# Patient Record
Sex: Female | Born: 1970 | Hispanic: No | Marital: Single | Smoking: Former smoker
Health system: Southern US, Community
[De-identification: ages and names within clinical notes are randomized; demographics above are authoritative.]

## PROBLEM LIST (undated history)

## (undated) DIAGNOSIS — Z9989 Dependence on other enabling machines and devices: Secondary | ICD-10-CM

## (undated) DIAGNOSIS — E669 Obesity, unspecified: Secondary | ICD-10-CM

## (undated) DIAGNOSIS — N921 Excessive and frequent menstruation with irregular cycle: Secondary | ICD-10-CM

## (undated) DIAGNOSIS — I509 Heart failure, unspecified: Secondary | ICD-10-CM

## (undated) DIAGNOSIS — I10 Essential (primary) hypertension: Secondary | ICD-10-CM

## (undated) DIAGNOSIS — G4733 Obstructive sleep apnea (adult) (pediatric): Secondary | ICD-10-CM

## (undated) DIAGNOSIS — K219 Gastro-esophageal reflux disease without esophagitis: Secondary | ICD-10-CM

## (undated) DIAGNOSIS — I2699 Other pulmonary embolism without acute cor pulmonale: Secondary | ICD-10-CM

## (undated) DIAGNOSIS — J449 Chronic obstructive pulmonary disease, unspecified: Secondary | ICD-10-CM

## (undated) DIAGNOSIS — D649 Anemia, unspecified: Secondary | ICD-10-CM

## (undated) DIAGNOSIS — E119 Type 2 diabetes mellitus without complications: Secondary | ICD-10-CM

## (undated) HISTORY — DX: Anemia, unspecified: D64.9

## (undated) HISTORY — DX: Obesity, unspecified: E66.9

## (undated) HISTORY — DX: Obstructive sleep apnea (adult) (pediatric): G47.33

## (undated) HISTORY — DX: Obstructive sleep apnea (adult) (pediatric): Z99.89

## (undated) HISTORY — DX: Excessive and frequent menstruation with irregular cycle: N92.1

## (undated) HISTORY — DX: Essential (primary) hypertension: I10

## (undated) HISTORY — DX: Chronic obstructive pulmonary disease, unspecified: J44.9

## (undated) HISTORY — DX: Gastro-esophageal reflux disease without esophagitis: K21.9

---

## 2001-08-27 ENCOUNTER — Other Ambulatory Visit: Admission: RE | Admit: 2001-08-27 | Discharge: 2001-08-27 | Payer: Self-pay | Admitting: Family Medicine

## 2002-02-06 ENCOUNTER — Encounter: Admission: RE | Admit: 2002-02-06 | Discharge: 2002-02-06 | Payer: Self-pay | Admitting: Family Medicine

## 2002-02-06 ENCOUNTER — Encounter: Payer: Self-pay | Admitting: Family Medicine

## 2002-07-22 ENCOUNTER — Encounter: Admission: RE | Admit: 2002-07-22 | Discharge: 2002-07-22 | Payer: Self-pay | Admitting: Family Medicine

## 2002-07-22 ENCOUNTER — Encounter: Payer: Self-pay | Admitting: Family Medicine

## 2003-04-04 DIAGNOSIS — J449 Chronic obstructive pulmonary disease, unspecified: Secondary | ICD-10-CM

## 2003-04-04 HISTORY — DX: Chronic obstructive pulmonary disease, unspecified: J44.9

## 2003-11-05 ENCOUNTER — Other Ambulatory Visit: Admission: RE | Admit: 2003-11-05 | Discharge: 2003-11-05 | Payer: Self-pay | Admitting: Family Medicine

## 2005-08-24 ENCOUNTER — Other Ambulatory Visit: Admission: RE | Admit: 2005-08-24 | Discharge: 2005-08-24 | Payer: Self-pay | Admitting: Family Medicine

## 2006-08-28 ENCOUNTER — Other Ambulatory Visit: Admission: RE | Admit: 2006-08-28 | Discharge: 2006-08-28 | Payer: Self-pay | Admitting: Family Medicine

## 2006-09-20 ENCOUNTER — Encounter: Admission: RE | Admit: 2006-09-20 | Discharge: 2006-09-20 | Payer: Self-pay | Admitting: Family Medicine

## 2011-10-31 ENCOUNTER — Telehealth: Payer: Self-pay

## 2011-10-31 NOTE — Telephone Encounter (Signed)
Message directed to [name]-Patient started period around the first of July and it may have stopped for about a week and then started up the third week and hasn't stopped since. It is not heavy regular except for a couple of days but she is concerned. Between 8:30-5:00 [phone]

## 2011-10-31 NOTE — Telephone Encounter (Signed)
She has appt scheduled in August she was advised to come in sooner to be checked. She will come in to see you on Friday.

## 2011-11-01 NOTE — Telephone Encounter (Signed)
Noted  

## 2011-11-02 ENCOUNTER — Encounter: Payer: Self-pay | Admitting: Physician Assistant

## 2011-11-23 ENCOUNTER — Encounter: Payer: Self-pay | Admitting: Physician Assistant

## 2011-11-23 ENCOUNTER — Ambulatory Visit (INDEPENDENT_AMBULATORY_CARE_PROVIDER_SITE_OTHER): Payer: BC Managed Care – PPO | Admitting: Physician Assistant

## 2011-11-23 VITALS — BP 158/90 | HR 81 | Temp 97.7°F | Resp 18 | Ht 64.5 in | Wt 372.0 lb

## 2011-11-23 DIAGNOSIS — Z1211 Encounter for screening for malignant neoplasm of colon: Secondary | ICD-10-CM

## 2011-11-23 DIAGNOSIS — J449 Chronic obstructive pulmonary disease, unspecified: Secondary | ICD-10-CM | POA: Insufficient documentation

## 2011-11-23 DIAGNOSIS — E669 Obesity, unspecified: Secondary | ICD-10-CM

## 2011-11-23 DIAGNOSIS — IMO0001 Reserved for inherently not codable concepts without codable children: Secondary | ICD-10-CM

## 2011-11-23 DIAGNOSIS — Z124 Encounter for screening for malignant neoplasm of cervix: Secondary | ICD-10-CM

## 2011-11-23 DIAGNOSIS — Z0000 Encounter for general adult medical examination without abnormal findings: Secondary | ICD-10-CM

## 2011-11-23 DIAGNOSIS — G4733 Obstructive sleep apnea (adult) (pediatric): Secondary | ICD-10-CM | POA: Insufficient documentation

## 2011-11-23 DIAGNOSIS — R8271 Bacteriuria: Secondary | ICD-10-CM

## 2011-11-23 DIAGNOSIS — R82998 Other abnormal findings in urine: Secondary | ICD-10-CM

## 2011-11-23 DIAGNOSIS — Z6841 Body Mass Index (BMI) 40.0 and over, adult: Secondary | ICD-10-CM | POA: Insufficient documentation

## 2011-11-23 DIAGNOSIS — Z309 Encounter for contraceptive management, unspecified: Secondary | ICD-10-CM

## 2011-11-23 DIAGNOSIS — Z9989 Dependence on other enabling machines and devices: Secondary | ICD-10-CM

## 2011-11-23 LAB — CBC WITH DIFFERENTIAL/PLATELET
Basophils Absolute: 0.1 10*3/uL (ref 0.0–0.1)
Basophils Relative: 1 % (ref 0–1)
Eosinophils Absolute: 0.3 10*3/uL (ref 0.0–0.7)
Eosinophils Relative: 3 % (ref 0–5)
HCT: 38.7 % (ref 36.0–46.0)
Hemoglobin: 12.8 g/dL (ref 12.0–15.0)
Lymphocytes Relative: 21 % (ref 12–46)
Lymphs Abs: 2.2 10*3/uL (ref 0.7–4.0)
MCH: 25.5 pg — ABNORMAL LOW (ref 26.0–34.0)
MCHC: 33.1 g/dL (ref 30.0–36.0)
MCV: 77.1 fL — ABNORMAL LOW (ref 78.0–100.0)
Monocytes Absolute: 0.6 10*3/uL (ref 0.1–1.0)
Monocytes Relative: 6 % (ref 3–12)
Neutro Abs: 7.2 10*3/uL (ref 1.7–7.7)
Neutrophils Relative %: 69 % (ref 43–77)
Platelets: 422 10*3/uL — ABNORMAL HIGH (ref 150–400)
RBC: 5.02 MIL/uL (ref 3.87–5.11)
RDW: 16 % — ABNORMAL HIGH (ref 11.5–15.5)
WBC: 10.3 10*3/uL (ref 4.0–10.5)

## 2011-11-23 LAB — GLUCOSE, POCT (MANUAL RESULT ENTRY): POC Glucose: 108 mg/dl — AB (ref 70–99)

## 2011-11-23 LAB — POCT URINALYSIS DIPSTICK
Bilirubin, UA: NEGATIVE
Blood, UA: NEGATIVE
Glucose, UA: NEGATIVE
Ketones, UA: NEGATIVE
Leukocytes, UA: NEGATIVE
Nitrite, UA: NEGATIVE
Protein, UA: NEGATIVE
Spec Grav, UA: >=1.03
Urobilinogen, UA: 0.2
pH, UA: 5.5

## 2011-11-23 LAB — POCT UA - MICROSCOPIC ONLY
Casts, Ur, LPF, POC: NEGATIVE
Crystals, Ur, HPF, POC: POSITIVE
Yeast, UA: NEGATIVE

## 2011-11-23 LAB — POCT GLYCOSYLATED HEMOGLOBIN (HGB A1C): Hemoglobin A1C: 5.7

## 2011-11-23 LAB — POCT URINE PREGNANCY: Preg Test, Ur: NEGATIVE

## 2011-11-23 MED ORDER — NORGESTIMATE-ETH ESTRADIOL 0.25-35 MG-MCG PO TABS: 1.0000 | ORAL_TABLET | Freq: Every day | ORAL | Status: DC

## 2011-11-23 NOTE — Patient Instructions (Addendum)
Please seriously consider getting a mammogram this year. You MAY have a UTI.  I have sent your urine specimen for a culture and will let you know when I get the results.  If you develop urinary urgency, frequency or burning in the meantime, please call.  Keeping You Healthy  Get These Tests 1. Blood Pressure- Have your blood pressure checked once a year by your health care provider.  Normal blood pressure is 120/80. 2. Weight- Have your body mass index (BMI) calculated to screen for obesity.  BMI is measure of body fat based on height and weight.  You can also calculate your own BMI at https://www.west-esparza.com/. 3. Cholesterol- Have your cholesterol checked every 5 years starting at age 48 then yearly starting at age 54. 4. Chlamydia, HIV, and other sexually transmitted diseases- Get screened every year until age 26, then within three months of each new sexual provider. 5. Pap Smear- Every 1-3 years; discuss with your health care provider. 6. Mammogram- Every year starting at age 67  Take these medicines  Calcium with Vitamin D-Your body needs 1200 mg of Calcium each day and [phone] IU of Vitamin D daily.  Your body can only absorb 500 mg of Calcium at a time so Calcium must be taken in 2 or 3 divided doses throughout the day.  Multivitamin with folic acid- Once daily if it is possible for you to become pregnant.  Get these Immunizations  Tetanus shot- Every 10 years.  Flu shot-Every year.  Take these steps 1. Do not smoke-Your healthcare provider can help you quit.  For tips on how to quit go to www.smokefree.gov or call 1-800 QUITNOW. 2. Be physically active- Exercise 5 days a week for at least 30 minutes.  If you are not already physically active, start slow and gradually work up to 30 minutes of moderate physical activity.  Examples of moderate activity include walking briskly, dancing, swimming, bicycling, etc. 3. Breast Cancer- A self breast exam every month is important for early  detection of breast cancer.  For more information and instruction on self breast exams, ask your healthcare provider or [email or web address]. 4. Eat a healthy diet- Eat a variety of healthy foods such as fruits, vegetables, whole grains, low fat milk, low fat cheeses, yogurt, lean meats, poultry and fish, beans, nuts, tofu, etc.  For more information go to www. Thenutritionsource.org 5. Drink alcohol in moderation- Limit alcohol intake to one drink or less per day. Never drink and drive. 6. Depression- Your emotional health is as important as your physical health.  If you're feeling down or losing interest in things you normally enjoy please talk to your healthcare provider about being screened for depression. 7. Dental visit- Brush and floss your teeth twice daily; visit your dentist twice a year. 8. Eye doctor- Get an eye exam at least every 2 years. 9. Helmet use- Always wear a helmet when riding a bicycle, motorcycle, rollerblading or skateboarding. 10. Safe sex- If you may be exposed to sexually transmitted infections, use a condom. 11. Seat belts- Seat belts can save your live; always wear one. 12. Smoke/Carbon Monoxide detectors- These detectors need to be installed on the appropriate level of your home. Replace batteries at least once a year. 13. Skin cancer- When out in the sun please cover up and use sunscreen 15 SPF or higher. 14. Violence- If anyone is threatening or hurting you, please tell your healthcare provider.

## 2011-11-23 NOTE — Progress Notes (Signed)
Subjective:    Patient ID: Tonya Moore, female    DOB: 03/02/1971, 41 y.o.   MRN: [id number]  HPI  This 41 y.o. female presents for CPE.  She is getting motivated for lifestyle changes for weight loss. "I know what I need to do," but isn't doing it.  She is in a new relationship and would like to start hormonal contraception.  Currently uses condoms.  Is not interested in an IUD.   Review of Systems  Constitutional: Negative.   HENT: Negative.   Eyes: Negative.   Respiratory: Negative.   Cardiovascular: Negative.   Gastrointestinal: Negative.   Genitourinary: Positive for menstrual problem (heavy, long menstrual bleeding in June 2013, no bleeding since then.). Negative for dysuria, urgency, frequency, hematuria, flank pain, decreased urine volume, vaginal bleeding, vaginal discharge, enuresis, difficulty urinating, genital sores, vaginal pain, pelvic pain and dyspareunia.  Musculoskeletal: Negative.   Skin: Negative.   Neurological: Negative.   Hematological: Negative.   Psychiatric/Behavioral: Negative.       Past Medical History  Diagnosis Date  . OSA on CPAP   . Obesity   . COPD (chronic obstructive pulmonary disease) 2005    History reviewed. No pertinent past surgical history.  Prior to Admission medications   Not on File    No Known Allergies  History   Social History  . Marital Status: Single    Spouse Name: n/a    Number of Children: 0  . Years of Education: 16   Occupational History  . Building control surveyor Support    Social History Main Topics  . Smoking status: Former Smoker    Types: Cigarettes    Quit date: 02/23/2004  . Smokeless tobacco: Never Used  . Alcohol Use: Yes     rare  . Drug Use: No  . Sexually Active: Yes -- Female partner(s)    Birth Control/ Protection: Condom   Other Topics Concern  . Not on file   Social History Narrative  . No narrative on file    Family History  Problem Relation Age of Onset  . Cancer Mother     colorectal Ca  . Diabetes Mother        Objective:   Physical Exam  Vitals reviewed. Constitutional: She is oriented to person, place, and time. Vital signs are normal. She appears well-developed and well-nourished. No distress.       Morbidly obese  HENT:  Head: Normocephalic and atraumatic.  Right Ear: Hearing, tympanic membrane, external ear and ear canal normal. No foreign bodies.  Left Ear: Hearing, tympanic membrane, external ear and ear canal normal. No foreign bodies.  Nose: Nose normal.  Mouth/Throat: Uvula is midline, oropharynx is clear and moist and mucous membranes are normal. No oral lesions. Normal dentition. No dental abscesses or uvula swelling. No oropharyngeal exudate.  Eyes: Conjunctivae and EOM are normal. Pupils are equal, round, and reactive to light. Right eye exhibits no discharge. Left eye exhibits no discharge. No scleral icterus.  Fundoscopic exam:      The right eye shows no arteriolar narrowing, no AV nicking, no exudate, no hemorrhage and no papilledema. The right eye shows red reflex.The right eye shows no venous pulsations.      The left eye shows no arteriolar narrowing, no AV nicking, no exudate, no hemorrhage and no papilledema. The left eye shows red reflex.The left eye shows no venous pulsations. Neck: Trachea normal, normal range of motion and full passive range of motion without pain. Neck supple. No  spinous process tenderness and no muscular tenderness present. No mass and no thyromegaly present.  Cardiovascular: Normal rate, regular rhythm, normal heart sounds, intact distal pulses and normal pulses.   Pulmonary/Chest: Effort normal and breath sounds normal. She exhibits no tenderness and no retraction. Right breast exhibits no inverted nipple, no mass, no nipple discharge, no skin change and no tenderness. Left breast exhibits no inverted nipple, no mass, no nipple discharge, no skin change and no tenderness. Breasts are symmetrical.  Abdominal:  Soft. Normal appearance and bowel sounds are normal. She exhibits no distension and no mass. There is no hepatosplenomegaly. There is no tenderness. There is no rigidity, no rebound, no guarding, no CVA tenderness, no tenderness at McBurney's point and negative Murphy's sign. No hernia. Hernia confirmed negative in the right inguinal area and confirmed negative in the left inguinal area.  Genitourinary: Rectum normal, vagina normal and uterus normal. Rectal exam shows no external hemorrhoid and no fissure. No breast swelling, tenderness, discharge or bleeding. Pelvic exam was performed with patient supine. No labial fusion. There is no rash, tenderness, lesion or injury on the right labia. There is no rash, tenderness, lesion or injury on the left labia. Cervix exhibits no motion tenderness, no discharge and no friability. Right adnexum displays no mass, no tenderness and no fullness. Left adnexum displays no mass, no tenderness and no fullness. No erythema, tenderness or bleeding around the vagina. No foreign body around the vagina. No signs of injury around the vagina. No vaginal discharge found.  Musculoskeletal: She exhibits no edema and no tenderness.       Cervical back: Normal.       Thoracic back: Normal.       Lumbar back: Normal.  Lymphadenopathy:       Head (right side): No tonsillar, no preauricular, no posterior auricular and no occipital adenopathy present.       Head (left side): No tonsillar, no preauricular, no posterior auricular and no occipital adenopathy present.    She has no cervical adenopathy.    She has no axillary adenopathy.       Right: No inguinal and no supraclavicular adenopathy present.       Left: No inguinal and no supraclavicular adenopathy present.  Neurological: She is alert and oriented to person, place, and time. She has normal strength and normal reflexes. No cranial nerve deficit. She exhibits normal muscle tone. Coordination and gait normal.  Skin: Skin is  warm, dry and intact. No rash noted. She is not diaphoretic. No cyanosis or erythema. Nails show no clubbing.  Psychiatric: She has a normal mood and affect. Her speech is normal and behavior is normal. Judgment and thought content normal.   Results for orders placed in visit on 11/23/11  POCT UA - MICROSCOPIC ONLY      Component Value Range   WBC, Ur, HPF, POC 6-10     RBC, urine, microscopic 3-4     Bacteria, U Microscopic 3+ bacilli     Mucus, UA 3+     Epithelial cells, urine per micros 8-12     Crystals, Ur, HPF, POC pos     Casts, Ur, LPF, POC neg     Yeast, UA neg    POCT URINALYSIS DIPSTICK      Component Value Range   Color, UA yellow     Clarity, UA hazy     Glucose, UA neg     Bilirubin, UA neg     Ketones, UA neg  Spec Grav, UA >=1.030     Blood, UA neg     pH, UA 5.5     Protein, UA neg     Urobilinogen, UA 0.2     Nitrite, UA neg     Leukocytes, UA Negative    POCT URINE PREGNANCY      Component Value Range   Preg Test, Ur Negative    GLUCOSE, POCT (MANUAL RESULT ENTRY)      Component Value Range   POC Glucose 108 (*) 70 - 99 mg/dl  POCT GLYCOSYLATED HEMOGLOBIN (HGB A1C)      Component Value Range   Hemoglobin A1C 5.7    IFOBT (OCCULT BLOOD)      Component Value Range   IFOBT Negative        Assessment & Plan:   1. Routine general medical examination at a health care facility  CBC with Differential, POCT UA - Microscopic Only, POCT urinalysis dipstick, Comprehensive metabolic panel, POCT glycosylated hemoglobin (Hb A1C)  2. Screening for cervical cancer  Pap IG, CT/NG NAA, and HPV (high risk), POCT glycosylated hemoglobin (Hb A1C)  3. Screening for colon cancer  POCT glycosylated hemoglobin (Hb A1C), IFOBT POC (occult bld, rslt in office)  4. Contraception  POCT urine pregnancy, norgestimate-ethinyl estradiol (ORTHO-CYCLEN,SPRINTEC,PREVIFEM) 0.25-35 MG-MCG tablet, POCT glycosylated hemoglobin (Hb A1C)  5. OSA on CPAP  POCT glycosylated hemoglobin (Hb  A1C)  6. Obesity  TSH, POCT glucose (manual entry), POCT glycosylated hemoglobin (Hb A1C)  7. Bacteriuria with pyuria  Urine culture

## 2011-11-24 LAB — COMPREHENSIVE METABOLIC PANEL
ALT: 18 U/L (ref 0–35)
AST: 16 U/L (ref 0–37)
Albumin: 3.5 g/dL (ref 3.5–5.2)
Alkaline Phosphatase: 72 U/L (ref 39–117)
BUN: 23 mg/dL (ref 6–23)
CO2: 26 mEq/L (ref 19–32)
Calcium: 8.9 mg/dL (ref 8.4–10.5)
Chloride: 106 mEq/L (ref 96–112)
Creat: 0.85 mg/dL (ref 0.50–1.10)
Glucose, Bld: 103 mg/dL — ABNORMAL HIGH (ref 70–99)
Potassium: 4.3 mEq/L (ref 3.5–5.3)
Sodium: 139 mEq/L (ref 135–145)
Total Bilirubin: 0.3 mg/dL (ref 0.3–1.2)
Total Protein: 6.4 g/dL (ref 6.0–8.3)

## 2011-11-24 LAB — TSH: TSH: 2.509 u[IU]/mL (ref 0.350–4.500)

## 2011-11-25 LAB — URINE CULTURE: Colony Count: 75000

## 2011-11-27 LAB — PAP IG, CT-NG NAA, HPV HIGH-RISK
Chlamydia Probe Amp: NEGATIVE
GC Probe Amp: NEGATIVE
HPV DNA High Risk: NOT DETECTED

## 2011-11-28 ENCOUNTER — Encounter: Payer: Self-pay | Admitting: Physician Assistant

## 2011-12-08 ENCOUNTER — Telehealth: Payer: Self-pay

## 2011-12-08 NOTE — Telephone Encounter (Signed)
Called patient to get info for [facility], who is out of office until tomorrow.  Patient wanted her to know that she is on her second week of BCPs and has had her period this entire time.  2 weeks of heavy menstrual flow that has been a little lighter for the past 2 days.  Patient doesn't know if she should be concerned by this, or just give it more time.  This is the second time this has happened (3weeks in July) and again now.  Note to [name] to advise.

## 2011-12-08 NOTE — Telephone Encounter (Signed)
The patient called and requested a return call from [place] to discuss some issues she is currently experiencing. Please call the patient at work (until 5pm) at [phone].

## 2011-12-09 NOTE — Telephone Encounter (Signed)
Advised pt of note 

## 2011-12-09 NOTE — Telephone Encounter (Signed)
Please advise her that it can take several months before periods adjust and lighten/shorten when starting a new hormonal contraception (even 3-6 months, though usually less).  At this point, she doesn't need to be concerned, and should give it more time.

## 2011-12-29 ENCOUNTER — Telehealth: Payer: Self-pay

## 2011-12-29 DIAGNOSIS — G4730 Sleep apnea, unspecified: Secondary | ICD-10-CM

## 2011-12-29 NOTE — Telephone Encounter (Signed)
Called pt to advise I am sending this fax for her. She is asked about sleep study, she had it in [place], it is not in her chart I will fax order without the study, she is aware if they need it she will have to try to get it.

## 2011-12-29 NOTE — Telephone Encounter (Signed)
Yes - usually we have to find previous sleep study and send that.

## 2011-12-29 NOTE — Telephone Encounter (Signed)
?   Okay to send in order for CPAP supplies?

## 2011-12-29 NOTE — Telephone Encounter (Signed)
PT OF [name] - SHE HAS A SLEEP APNEA MACHINE AND NEEDS A NEW MASK.  SHE HAS BEEN USING THE SAME ONE FOR QUITE SOME TIME.  SHE SPOKE WITH SANDRA AT [facility] HIS MORNING AND THEY ARE SAYING WE NEED TO FAX A REFILL REQUEST IN ANY WAY AND THEN SHE WILL BE ABLE TO ALSO GET THE NEW MASK.  Tonya Moore CAN BE REACHED AT [phone].  [facility] AT [facility] IS AT [phone] AND THE FAX NUMBER IS [phone]

## 2012-01-27 ENCOUNTER — Ambulatory Visit (INDEPENDENT_AMBULATORY_CARE_PROVIDER_SITE_OTHER): Payer: BC Managed Care – PPO | Admitting: Family Medicine

## 2012-01-27 VITALS — BP 188/102 | HR 80 | Temp 98.2°F | Resp 16 | Ht 64.0 in

## 2012-01-27 DIAGNOSIS — K120 Recurrent oral aphthae: Secondary | ICD-10-CM

## 2012-01-27 DIAGNOSIS — K1370 Unspecified lesions of oral mucosa: Secondary | ICD-10-CM

## 2012-01-27 DIAGNOSIS — K1379 Other lesions of oral mucosa: Secondary | ICD-10-CM

## 2012-01-27 LAB — POCT CBC
Granulocyte percent: 72.3 %G (ref 37–80)
MCH, POC: 25.2 pg — AB (ref 27–31.2)
MID (cbc): 0.5 (ref 0–0.9)
MPV: 9.8 fL (ref 0–99.8)
POC Granulocyte: 6.2 (ref 2–6.9)
POC MID %: 5.9 %M (ref 0–12)
Platelet Count, POC: 464 10*3/uL — AB (ref 142–424)
RBC: 5.55 M/uL — AB (ref 4.04–5.48)
WBC: 8.6 10*3/uL (ref 4.6–10.2)

## 2012-01-27 MED ORDER — AMOXICILLIN 875 MG PO TABS: 875.0000 mg | ORAL_TABLET | Freq: Two times a day (BID) | ORAL | Status: DC

## 2012-01-27 MED ORDER — OXYCODONE-ACETAMINOPHEN 5-325 MG PO TABS: 2.0000 | ORAL_TABLET | ORAL | Status: DC | PRN

## 2012-01-27 NOTE — Progress Notes (Signed)
Subjective:    Patient ID: Tonya Moore, female    DOB: [birth year], 41 y.o.   MRN: [id number]  HPI Tonya Moore is a 41 y.o. female Wisdom tooth extracted 3 days ago - 2 hours to extract it - not under anesthesia - had topical novocaine. Surgery by Dr. Jerilee Field in [facility] [phone]), per paperwork - surgical extraction of rupted tooth #32.   Feels like it is getting infected - felt this way sometime yesterday.  Sore in area, and has bad taste.  Feels swollen, and bad taste.  No fever.  No antibiotics rx.  Had few stitches.  oxycodone prescribed - approx 12-15.  Instructed to take 1-2 every 3 -4 hours as needed.  Taking every 4 hours, and had to double up last 2 nights. Ran out of meds yesterday - now taking bayer extra strength.  Has not called dentist - closed yesterday and today.   No chest pain, no difficulty breathing. No meds for high blood pressure, but slightly up at last office visit.    Review of Systems As above. No cp/sob, no weakness, slurred speech.    Objective:   Physical Exam  Constitutional: She is oriented to person, place, and time. She appears well-developed and well-nourished.       Obese.  HENT:  Head: Normocephalic and atraumatic.    Mouth/Throat: Mucous membranes are normal. Oral lesions present. No dental abscesses. No oropharyngeal exudate, posterior oropharyngeal edema, posterior oropharyngeal erythema or tonsillar abscesses.    Neurological: She is oriented to person, place, and time.   No recent listings on controlled substance database.  Results for orders placed in visit on 01/27/12  POCT CBC      Component Value Range   WBC 8.6  4.6 - 10.2 K/uL   Lymph, poc 1.9  0.6 - 3.4   POC LYMPH PERCENT 21.8  10 - 50 %L   MID (cbc) 0.5  0 - 0.9   POC MID % 5.9  0 - 12 %M   POC Granulocyte 6.2  2 - 6.9   Granulocyte percent 72.3  37 - 80 %G   RBC 5.55 (*) 4.04 - 5.48 M/uL   Hemoglobin 14.0  12.2 - 16.2 g/dL   HCT, POC 29.5  62.1 - 47.9 %   MCV  82.8  80 - 97 fL   MCH, POC 25.2 (*) 27 - 31.2 pg   MCHC 30.4 (*) 31.8 - 35.4 g/dL   RDW, POC 30.8     Platelet Count, POC 464 (*) 142 - 424 K/uL   MPV 9.8  0 - 99.8 fL       Assessment & Plan:  Tonya Moore is a 41 y.o. female 1. Pain in mouth  POCT CBC, amoxicillin (AMOXIL) 875 MG tablet, oxyCODONE-acetaminophen (PERCOCET/ROXICET) 5-325 MG per tablet  2. Aphthous ulcer of tongue  POCT CBC, amoxicillin (AMOXIL) 875 MG tablet, oxyCODONE-acetaminophen (PERCOCET/ROXICET) 5-325 MG per tablet   No apparent abcess, but needs to follow up with dentist in 2 days for recheck will cover with amox until seen.  Rtc/er precautions discussed  - including s/sx of abcess. Percocet 1 to 2 every 6 hours as needed. Advised of antibiotics and ocp's - backup form of contraception.   HTN - prior mild elevation - likely up with pain now. No secondary symptoms.  Treat pain as above, recheck OV next 2 weeks to recheck BP with primary provider.  Rtc/er precautions.  Patient Instructions  Start antibiotic, and can  restart pain medicine as needed for tooth pain.  Continue salt water swishes, cold compress to face as needed for swelling and call your dental provider Monday morning to be seen. Return to the clinic or go to the nearest emergency room if any of your symptoms worsen or new symptoms occur.

## 2012-01-27 NOTE — Patient Instructions (Signed)
Start antibiotic, and can restart pain medicine as needed for tooth pain.  Continue salt water swishes, cold compress to face as needed for swelling and call your dental provider Monday morning to be seen. Return to the clinic or go to the nearest emergency room if any of your symptoms worsen or new symptoms occur.

## 2012-05-25 ENCOUNTER — Telehealth: Payer: Self-pay

## 2012-05-25 NOTE — Telephone Encounter (Signed)
WANTS TO TALK TO SOMEONE ABOUT WHY SHES STILL BLEEDING ON THIS NEW BIRTH CONTROL. WANTS TO KNOW IF [name] CAN RX SOMETHING STRONGER? LIKE A STRONGER DOSE TO FIND THE RIGHT LEVELS FOR HER. PLEASE CALL BACK IMMEDIATELY, PATIENT IS VERY WORRIED ABOUT WHY SHE IS STILL BLEEDING THROUGH, SHE HAS JUST STARTED THIS CONTRACEPTION. PLEASE CALL BACK AT: [phone]

## 2012-05-26 NOTE — Telephone Encounter (Signed)
It looks like pt was seen by Tonya Moore in 8/13 and is she is still bleeding I think that seeing Tonya Moore again would be the best option to discuss next step.

## 2012-05-26 NOTE — Telephone Encounter (Signed)
Pt will be in to [name] to discuss.

## 2012-05-27 ENCOUNTER — Telehealth: Payer: Self-pay

## 2012-05-27 NOTE — Telephone Encounter (Signed)
She should RTC to discuss/evaluate and determine options.

## 2012-05-27 NOTE — Telephone Encounter (Signed)
[name] - You have prescribed the patient sprintec and she is wondering if she needs a higher dosage.  Says she is still bleeding through a little between periods and having other symptoms of a period even though its not time for it.  [phone] (until 5pm) [phone] - after 5 p.m.

## 2012-05-27 NOTE — Telephone Encounter (Signed)
Is this occuring monthly? Or just this month? I have called her and this is happening monthly she is wanting to know if there is another BCP she may need. Please advise.

## 2012-05-28 NOTE — Telephone Encounter (Signed)
Left message for her to call me back. 

## 2012-05-28 NOTE — Telephone Encounter (Signed)
Pt will come make an appt to come in

## 2012-07-11 ENCOUNTER — Encounter: Payer: Self-pay | Admitting: Physician Assistant

## 2012-07-11 ENCOUNTER — Ambulatory Visit (INDEPENDENT_AMBULATORY_CARE_PROVIDER_SITE_OTHER): Payer: BC Managed Care – PPO | Admitting: Physician Assistant

## 2012-07-11 VITALS — BP 167/101 | HR 90 | Temp 98.3°F | Resp 18 | Ht 64.0 in | Wt 385.0 lb

## 2012-07-11 DIAGNOSIS — N920 Excessive and frequent menstruation with regular cycle: Secondary | ICD-10-CM

## 2012-07-11 DIAGNOSIS — R030 Elevated blood-pressure reading, without diagnosis of hypertension: Secondary | ICD-10-CM

## 2012-07-11 DIAGNOSIS — IMO0001 Reserved for inherently not codable concepts without codable children: Secondary | ICD-10-CM

## 2012-07-11 DIAGNOSIS — E669 Obesity, unspecified: Secondary | ICD-10-CM

## 2012-07-11 DIAGNOSIS — Z9989 Dependence on other enabling machines and devices: Secondary | ICD-10-CM

## 2012-07-11 DIAGNOSIS — R5383 Other fatigue: Secondary | ICD-10-CM

## 2012-07-11 DIAGNOSIS — R5381 Other malaise: Secondary | ICD-10-CM

## 2012-07-11 DIAGNOSIS — G4733 Obstructive sleep apnea (adult) (pediatric): Secondary | ICD-10-CM

## 2012-07-11 LAB — CBC WITH DIFFERENTIAL/PLATELET
Basophils Relative: 1 % (ref 0–1)
Eosinophils Absolute: 0.2 10*3/uL (ref 0.0–0.7)
Eosinophils Relative: 2 % (ref 0–5)
MCH: 25.7 pg — ABNORMAL LOW (ref 26.0–34.0)
MCHC: 34.5 g/dL (ref 30.0–36.0)
MCV: 74.3 fL — ABNORMAL LOW (ref 78.0–100.0)
Neutrophils Relative %: 68 % (ref 43–77)
Platelets: 528 10*3/uL — ABNORMAL HIGH (ref 150–400)
RDW: 15.8 % — ABNORMAL HIGH (ref 11.5–15.5)

## 2012-07-11 LAB — COMPREHENSIVE METABOLIC PANEL
ALT: 18 U/L (ref 0–35)
Alkaline Phosphatase: 94 U/L (ref 39–117)
Creat: 0.72 mg/dL (ref 0.50–1.10)
Sodium: 137 mEq/L (ref 135–145)
Total Bilirubin: 0.4 mg/dL (ref 0.3–1.2)
Total Protein: 7 g/dL (ref 6.0–8.3)

## 2012-07-11 LAB — TSH: TSH: 2.167 u[IU]/mL (ref 0.350–4.500)

## 2012-07-11 NOTE — Progress Notes (Signed)
Subjective:    Patient ID: Tonya Moore, female    DOB: 1970/09/25, 42 y.o.   MRN: [id number]  HPI This 42 y.o. female presents for evaluation of vaginal bleeding.  She also reports feeling really tired, stressed, and frustrated by weight gain.  She was started on COC in 11/2011 when she began a new sexual relationship.  Her periods became very regular and were shorter and lighter than before.  In March, however, her bleeding was heavier than usual, the blood was "very dark," and she bled for nearly 3 weeks, though the last 10-14 days were "more like spotting." She wonders if she needs to change pills.  Past Medical History  Diagnosis Date  . OSA on CPAP   . Obesity   . COPD (chronic obstructive pulmonary disease) 2005    History reviewed. No pertinent past surgical history.  Prior to Admission medications   Medication Sig Start Date End Date Taking? Authorizing Provider  Multiple Vitamin (MULTIVITAMIN) tablet Take 1 tablet by mouth daily.   Yes Historical Provider, MD  norgestimate-ethinyl estradiol (ORTHO-CYCLEN,SPRINTEC,PREVIFEM) 0.25-35 MG-MCG tablet Take 1 tablet by mouth daily. 11/23/11 11/22/12 Yes Elisse Pennick S Ter[place] Aven[place], PA-C    No Known Allergies  History   Social History  . Marital Status: Single    Spouse Name: n/a    Number of Children: 0  . Years of Education: 16   Occupational History  . Building control surveyor Support    Social History Main Topics  . Smoking status: Former Smoker    Types: Cigarettes    Quit date: 02/23/2004  . Smokeless tobacco: Never Used  . Alcohol Use: No     Comment: rare  . Drug Use: No  . Sexually Active: Yes -- Female partner(s)    Birth Control/ Protection: Pill, Condom   Other Topics Concern  . Not on file   Social History Narrative   Lives alone with her cat.    Family History  Problem Relation Age of Onset  . Cancer Mother     colorectal Ca  . Diabetes Mother       Review of Systems  Constitutional: Positive for  appetite change ("I east when I get stressed and frustrated.  I'm not good at portion control."), fatigue (has improved some after re-starting CPAP a couple of weeks ago, after not using it for several months) and unexpected weight change (weight is up 13 pounds since 11/2011). Negative for fever, chills, diaphoresis and activity change.  HENT: Negative.   Eyes: Negative.   Respiratory: Negative.        She has a new pet, a cat, but hasn't noticed any worsening respiratory symptoms or allergy-type symptoms.  Cardiovascular: Negative.   Gastrointestinal: Negative.   Endocrine: Negative.   Genitourinary: Negative.   Musculoskeletal: Positive for back pain, arthralgias (back, hip and knee pain, "I know it's my weight.") and gait problem ("I walk differently because of my weight").  Skin: Negative.   Allergic/Immunologic: Negative.   Neurological: Negative.   Hematological: Negative.   Psychiatric/Behavioral: Positive for dysphoric mood (not interested in counseling at this time.). Negative for suicidal ideas, hallucinations, behavioral problems, confusion, sleep disturbance, self-injury, decreased concentration and agitation. The patient is not nervous/anxious and is not hyperactive.        Objective:   Physical Exam Blood pressure 167/101, pulse 90, temperature 98.3 F (36.8 C), resp. rate 18, height 5\' 4"  (1.626 m), weight 385 lb (174.635 kg), last menstrual period 06/01/2012. Body mass index is 66.05 kg/(m^2).  Well-developed, well nourished obese WF who is awake, alert and oriented, in NAD. HEENT: [place]/AT, PERRL, EOMI.  Sclera and conjunctiva are clear.  EAC are patent, TMs are normal in appearance. Nasal mucosa is pink and moist. OP is clear. Neck: supple, non-tender, no lymphadenopathy, thyromegaly. Heart: RRR, no murmur Lungs: normal effort, CTA Extremities: no cyanosis, clubbing or edema. Skin: warm and dry without rash. Psychologic: good mood and appropriate affect, normal speech and  behavior.        Assessment & Plan:  Menorrhagia - Plan: CBC with Differential, TSH; given the overall improvement in her menses on the current COC, we'll continue it for now, pending the lab results, and then see if she returns to the improved state.  Elevated BP - Plan: Comprehensive metabolic panel, TSH; Stress relief, weight loss, consistent CPAP use.  RTC 6 weeks.  Other malaise and fatigue - Plan: Comprehensive metabolic panel; await labs.  Stress relief, consistent CPAP use.  RTC 6 weeks.  Obesity - Plan: Exercise, stress relief, reduced calories.  Declines nutrition counseling again.  OSA on CPAP   Patient Instructions  Read Fleeta Emmer book Food Rules for some good eating advice. You should eat 1600-1800 calories daily, mostly lean proteins and plants.  Limit your starches (grains, potatoes, pasta) to help. Drink at least 64 ounces of water daily. Wear your CPAP every single night. Plan activities that keep you active and distracted to do when you feel like eating. Look into joining a pool, as pool exercise can reduce the joint pain associated with exercise. You should aim for 1-2 pounds of weight loss each week, which is 6-12 pounds in the next 6 weeks. Look for ways to change the negative into positive, the frustration into positive motivation.    Fernande Bras, PA-C Physician Assistant-Certified [facility]

## 2012-07-11 NOTE — Patient Instructions (Addendum)
Read Casimiro Needle Pollan's book Food Rules for some good eating advice. You should eat 1600-1800 calories daily, mostly lean proteins and plants.  Limit your starches (grains, potatoes, pasta) to help. Drink at least 64 ounces of water daily. Wear your CPAP every single night. Plan activities that keep you active and distracted to do when you feel like eating. Look into joining a pool, as pool exercise can reduce the joint pain associated with exercise. You should aim for 1-2 pounds of weight loss each week, which is 6-12 pounds in the next 6 weeks. Look for ways to change the negative into positive, the frustration into positive motivation.

## 2012-07-15 ENCOUNTER — Encounter: Payer: Self-pay | Admitting: Physician Assistant

## 2012-08-15 ENCOUNTER — Ambulatory Visit: Payer: BC Managed Care – PPO | Admitting: Physician Assistant

## 2012-10-21 ENCOUNTER — Telehealth: Payer: Self-pay | Admitting: Internal Medicine

## 2012-10-21 NOTE — Telephone Encounter (Signed)
Pt states she wants her rx for sprintex switched from [facility] to [facility]([address] [place])   Best phone for pt [phone]

## 2012-10-23 NOTE — Telephone Encounter (Signed)
lmom on vm that pt can call [facility] and get them to call the pharmacy that she has the rx at and they can transfer it for her

## 2012-12-22 ENCOUNTER — Other Ambulatory Visit: Payer: Self-pay | Admitting: Physician Assistant

## 2013-01-01 ENCOUNTER — Ambulatory Visit (INDEPENDENT_AMBULATORY_CARE_PROVIDER_SITE_OTHER): Payer: BC Managed Care – PPO | Admitting: Physician Assistant

## 2013-01-01 VITALS — BP 158/108 | HR 81 | Temp 98.8°F | Resp 20 | Ht 64.5 in | Wt 391.0 lb

## 2013-01-01 DIAGNOSIS — Z9989 Dependence on other enabling machines and devices: Secondary | ICD-10-CM

## 2013-01-01 DIAGNOSIS — R739 Hyperglycemia, unspecified: Secondary | ICD-10-CM

## 2013-01-01 DIAGNOSIS — N939 Abnormal uterine and vaginal bleeding, unspecified: Secondary | ICD-10-CM

## 2013-01-01 DIAGNOSIS — G4733 Obstructive sleep apnea (adult) (pediatric): Secondary | ICD-10-CM

## 2013-01-01 DIAGNOSIS — J449 Chronic obstructive pulmonary disease, unspecified: Secondary | ICD-10-CM

## 2013-01-01 DIAGNOSIS — I10 Essential (primary) hypertension: Secondary | ICD-10-CM

## 2013-01-01 DIAGNOSIS — Z23 Encounter for immunization: Secondary | ICD-10-CM

## 2013-01-01 DIAGNOSIS — Z0000 Encounter for general adult medical examination without abnormal findings: Secondary | ICD-10-CM

## 2013-01-01 DIAGNOSIS — N898 Other specified noninflammatory disorders of vagina: Secondary | ICD-10-CM

## 2013-01-01 DIAGNOSIS — E669 Obesity, unspecified: Secondary | ICD-10-CM

## 2013-01-01 LAB — POCT URINALYSIS DIPSTICK
Glucose, UA: NEGATIVE
Leukocytes, UA: NEGATIVE
Nitrite, UA: NEGATIVE
Protein, UA: 30
Urobilinogen, UA: 0.2

## 2013-01-01 LAB — COMPREHENSIVE METABOLIC PANEL
ALT: 15 U/L (ref 0–35)
AST: 16 U/L (ref 0–37)
Albumin: 3.5 g/dL (ref 3.5–5.2)
Alkaline Phosphatase: 90 U/L (ref 39–117)
Potassium: 4.2 mEq/L (ref 3.5–5.3)
Sodium: 135 mEq/L (ref 135–145)
Total Bilirubin: 0.4 mg/dL (ref 0.3–1.2)
Total Protein: 6.6 g/dL (ref 6.0–8.3)

## 2013-01-01 LAB — LIPID PANEL
LDL Cholesterol: 77 mg/dL (ref 0–99)
Triglycerides: 105 mg/dL (ref <150)
VLDL: 21 mg/dL (ref 0–40)

## 2013-01-01 LAB — PULMONARY FUNCTION TEST

## 2013-01-01 LAB — POCT UA - MICROSCOPIC ONLY
Casts, Ur, LPF, POC: NEGATIVE
Crystals, Ur, HPF, POC: NEGATIVE

## 2013-01-01 LAB — POCT CBC
Granulocyte percent: 76.1 %G (ref 37–80)
MCH, POC: 25.4 pg — AB (ref 27–31.2)
MCV: 82 fL (ref 80–97)
MID (cbc): 0.6 (ref 0–0.9)
MPV: 9 fL (ref 0–99.8)
POC LYMPH PERCENT: 18.6 %L (ref 10–50)
POC MID %: 5.3 %M (ref 0–12)
Platelet Count, POC: 407 10*3/uL (ref 142–424)
RBC: 4.84 M/uL (ref 4.04–5.48)
RDW, POC: 17.3 %
WBC: 10.4 10*3/uL — AB (ref 4.6–10.2)

## 2013-01-01 MED ORDER — ALBUTEROL SULFATE HFA 108 (90 BASE) MCG/ACT IN AERS: 2.0000 | INHALATION_SPRAY | RESPIRATORY_TRACT | Status: DC | PRN

## 2013-01-01 MED ORDER — LISINOPRIL 10 MG PO TABS: 10.0000 mg | ORAL_TABLET | Freq: Every day | ORAL | Status: DC

## 2013-01-01 MED ORDER — NORETHINDRONE-ETH ESTRADIOL 1-35 MG-MCG PO TABS: 1.0000 | ORAL_TABLET | Freq: Every day | ORAL | Status: DC

## 2013-01-01 NOTE — Progress Notes (Signed)
Appt made for 02/13/13.

## 2013-01-01 NOTE — Patient Instructions (Signed)
I will contact you with your lab results as soon as they are available.   If you have not heard from me in 2 weeks, please contact me.  The fastest way to get your results is to register for My Chart (see the instructions on the last page of this printout).  Keeping You Healthy  Get These Tests 1. Blood Pressure- Have your blood pressure checked once a year by your health care provider.  Normal blood pressure is 120/80. 2. Weight- Have your body mass index (BMI) calculated to screen for obesity.  BMI is measure of body fat based on height and weight.  You can also calculate your own BMI at https://www.west-esparza.com/. 3. Cholesterol- Have your cholesterol checked every 5 years starting at age 58 then yearly starting at age 11. 4. Chlamydia, HIV, and other sexually transmitted diseases- Get screened every year until age 72, then within three months of each new sexual provider. 5. Pap Smear- Every 1-5 years; discuss with your health care provider. (you need this again in 2017) 6. Mammogram- Every year starting at age 26 (we'll do it in 68)  Take these medicines  Calcium with Vitamin D-Your body needs 1200 mg of Calcium each day and [phone] IU of Vitamin D daily.  Your body can only absorb 500 mg of Calcium at a time so Calcium must be taken in 2 or 3 divided doses throughout the day.  Multivitamin with folic acid- Once daily if it is possible for you to become pregnant.  Get these Immunizations  Gardasil-Series of three doses; prevents HPV related illness such as genital warts and cervical cancer.  Menactra-Single dose; prevents meningitis.  Tetanus shot- Every 10 years.  Flu shot-Every year.  Take these steps 1. Do not smoke-Your healthcare provider can help you quit.  For tips on how to quit go to www.smokefree.gov or call 1-800 QUITNOW. 2. Be physically active- Exercise 5 days a week for at least 30 minutes.  If you are not already physically active, start slow and gradually work up  to 30 minutes of moderate physical activity.  Examples of moderate activity include walking briskly, dancing, swimming, bicycling, etc. 3. Breast Cancer- A self breast exam every month is important for early detection of breast cancer.  For more information and instruction on self breast exams, ask your healthcare provider or [email or web address]. 4. Eat a healthy diet- Eat a variety of healthy foods such as fruits, vegetables, whole grains, low fat milk, low fat cheeses, yogurt, lean meats, poultry and fish, beans, nuts, tofu, etc.  For more information go to www. Thenutritionsource.org 5. Drink alcohol in moderation- Limit alcohol intake to one drink or less per day. Never drink and drive. 6. Depression- Your emotional health is as important as your physical health.  If you're feeling down or losing interest in things you normally enjoy please talk to your healthcare provider about being screened for depression. 7. Dental visit- Brush and floss your teeth twice daily; visit your dentist twice a year. 8. Eye doctor- Get an eye exam at least every 2 years. 9. Helmet use- Always wear a helmet when riding a bicycle, motorcycle, rollerblading or skateboarding. 10. Safe sex- If you may be exposed to sexually transmitted infections, use a condom. 11. Seat belts- Seat belts can save your live; always wear one. 12. Smoke/Carbon Monoxide detectors- These detectors need to be installed on the appropriate level of your home. Replace batteries at least once a year. 13. Skin cancer- When out in the  sun please cover up and use sunscreen 15 SPF or higher. 14. Violence- If anyone is threatening or hurting you, please tell your healthcare provider.

## 2013-01-01 NOTE — Progress Notes (Signed)
Subjective:    Patient ID: Tonya Moore, female    DOB: 03-14-1971, 42 y.o.   MRN: [id number]  HPI  [name] is a 42 year old obese female with COPD, OSA, and abnormal vaginal bleeding here today for a physical. She has been taking her current OCP for the past year and it has greatly improved her moods. She feels much more stable on this OCP, however, she is still having breakthrough bleeding. Her period will last for about a week and is heavy. THere are no clots but she has to use super tampons and pads daily. For about two weeks of the month, she will have light bleeding that is not constant but still requires use of a light tampon or panty liner. She has one week with no spotting or bleeding at all. She denies vaginal discharge, burning, or pain. She is not currently sexually active.   Review of Systems  Constitutional: Negative for fever, chills, activity change, appetite change and fatigue.  HENT: Negative for hearing loss, congestion, sore throat, rhinorrhea, sneezing, mouth sores, neck pain, tinnitus and ear discharge.   Eyes: Negative for pain, discharge and visual disturbance.  Respiratory: Positive for shortness of breath (with walking ). Negative for cough, chest tightness and wheezing.   Cardiovascular: Positive for leg swelling. Negative for chest pain. Palpitations: when she over does activity.  Gastrointestinal: Negative for nausea, abdominal pain, diarrhea, constipation and blood in stool.  Endocrine: Positive for heat intolerance. Negative for cold intolerance, polydipsia, polyphagia and polyuria.  Genitourinary: Positive for vaginal bleeding and menstrual problem. Negative for dysuria, frequency, hematuria, vaginal discharge, vaginal pain and pelvic pain.  Musculoskeletal: Positive for arthralgias (knees). Negative for back pain and gait problem.  Skin: Negative.   Allergic/Immunologic: Negative for environmental allergies and food allergies.  Neurological: Negative  for dizziness, syncope, weakness, light-headedness and headaches. Numbness: over left distal toe pad with exercise/walking at times.  Hematological: Negative for adenopathy.  Psychiatric/Behavioral: Negative for dysphoric mood and agitation. The patient is not nervous/anxious.        Objective:   Physical Exam  Vitals reviewed. Constitutional: She is oriented to person, place, and time.  Obese, elevated BP  HENT:  Head: Normocephalic and atraumatic.  Right Ear: Hearing, tympanic membrane, external ear and ear canal normal.  Left Ear: Hearing, tympanic membrane, external ear and ear canal normal.  Nose: Nose normal. No rhinorrhea.  Mouth/Throat: Uvula is midline, oropharynx is clear and moist and mucous membranes are normal. No oral lesions. Normal dentition. No dental caries.  Eyes: Conjunctivae, EOM and lids are normal. Pupils are equal, round, and reactive to light. Right eye exhibits no discharge. Left eye exhibits no discharge. No scleral icterus.  Neck: Trachea normal, normal range of motion and full passive range of motion without pain. Carotid bruit is not present. No mass present.  Cardiovascular: Normal rate, regular rhythm, S1 normal and S2 normal.   Pulses:      Radial pulses are 2+ on the right side, and 2+ on the left side.       Dorsalis pedis pulses are 2+ on the right side, and 2+ on the left side.       Posterior tibial pulses are 2+ on the right side, and 2+ on the left side.  Pulmonary/Chest: Effort normal and breath sounds normal. Right breast exhibits no inverted nipple, no mass, no nipple discharge, no skin change and no tenderness. Left breast exhibits no inverted nipple, no mass, no nipple discharge,  no skin change and no tenderness.  Abdominal: Soft. There is no hepatosplenomegaly. There is no tenderness.  Genitourinary:  Vaginal exam not indicated bc she is not having vaginal symptoms beyond bleeding and not at risk for STDs since not currently sexually active.  Pap and HPV testing not needed until 2017, negative HPV and Pap in 2012.   Musculoskeletal: Normal range of motion. She exhibits no edema and no tenderness.  Neurological: She is alert and oriented to person, place, and time. She has normal reflexes.  Skin: Skin is warm and dry.  Small 1 cm skin tag under right lateral abdominal fold. Nontender, non-inflammed.   Psychiatric: She has a normal mood and affect.    BP 158/108  Pulse 81  Temp(Src) 98.8 F (37.1 C) (Oral)  Resp 20  Ht 5' 4.5" (1.638 m)  Wt 391 lb (177.356 kg)  BMI 66.1 kg/m2  SpO2 98%  LMP 12/11/2012  Spirometry performed today reveals probable restriction with a decrease in FVC and FEV1 since last spirometry in 2009.      Assessment & Plan:  Routine general medical examination at a health care facility - Plan: POCT CBC, POCT UA - Microscopic Only, POCT urinalysis dipstick, Comprehensive metabolic panel, Lipid panel  Hypertension - Plan: Comprehensive metabolic panel, lisinopril (PRINIVIL,ZESTRIL) 10 MG tablet. F/U in 6 weeks for recheck of BP and CMP.   OSA on CPAP: Continue CPAP  COPD (chronic obstructive pulmonary disease): Start short acting B-agonist with exercise. Suspect 100+ lb weight gain over past 5 years is primary cause of worsening restrictive process. Needs to lose weight  Obesity: Encouraged weight loss, diet, and exercise  Vaginal bleeding, abnormal - Plan: Switch from sprintec to norethindrone-ethinyl estradiol 1/35 (ORTHO-NOVUM, NORTREL,CYCLAFEM) tablet. Will f/u in 3 months after being on new pill to see if improvement in bleeding and hormone mood swings.   Need for influenza vaccination - Plan: Flu Vaccine QUAD 36+ mos IM  Mammogram deferred this year. Time spent counseling pros and cons of screening this year. Patient will have one next year.

## 2013-01-01 NOTE — Progress Notes (Signed)
I have examined this patient along with the student and agree.  

## 2013-01-02 NOTE — Addendum Note (Signed)
Addended by: Fernande Bras on: 01/02/2013 11:18 PM   Modules accepted: Orders

## 2013-02-13 ENCOUNTER — Ambulatory Visit: Payer: BC Managed Care – PPO | Admitting: Physician Assistant

## 2013-02-26 ENCOUNTER — Encounter: Payer: Self-pay | Admitting: Physician Assistant

## 2013-03-06 ENCOUNTER — Encounter: Payer: Self-pay | Admitting: Physician Assistant

## 2013-03-06 ENCOUNTER — Ambulatory Visit (INDEPENDENT_AMBULATORY_CARE_PROVIDER_SITE_OTHER): Payer: BC Managed Care – PPO | Admitting: Physician Assistant

## 2013-03-06 VITALS — BP 160/96 | HR 86 | Temp 98.0°F | Resp 16 | Ht 62.5 in | Wt 387.0 lb

## 2013-03-06 DIAGNOSIS — I10 Essential (primary) hypertension: Secondary | ICD-10-CM

## 2013-03-06 DIAGNOSIS — J449 Chronic obstructive pulmonary disease, unspecified: Secondary | ICD-10-CM

## 2013-03-06 DIAGNOSIS — R7309 Other abnormal glucose: Secondary | ICD-10-CM

## 2013-03-06 DIAGNOSIS — E669 Obesity, unspecified: Secondary | ICD-10-CM

## 2013-03-06 LAB — COMPREHENSIVE METABOLIC PANEL
Albumin: 3.6 g/dL (ref 3.5–5.2)
Alkaline Phosphatase: 82 U/L (ref 39–117)
BUN: 15 mg/dL (ref 6–23)
CO2: 25 mEq/L (ref 19–32)
Chloride: 102 mEq/L (ref 96–112)
Creat: 0.79 mg/dL (ref 0.50–1.10)
Glucose, Bld: 85 mg/dL (ref 70–99)
Potassium: 4.1 mEq/L (ref 3.5–5.3)
Sodium: 137 mEq/L (ref 135–145)
Total Bilirubin: 0.5 mg/dL (ref 0.3–1.2)

## 2013-03-06 LAB — GLUCOSE, POCT (MANUAL RESULT ENTRY): POC Glucose: 104 mg/dl — AB (ref 70–99)

## 2013-03-06 LAB — POCT GLYCOSYLATED HEMOGLOBIN (HGB A1C): Hemoglobin A1C: 7.3

## 2013-03-06 MED ORDER — LISINOPRIL 20 MG PO TABS: 20.0000 mg | ORAL_TABLET | Freq: Every day | ORAL | Status: DC

## 2013-03-06 NOTE — Progress Notes (Signed)
   Subjective:    Patient ID: Tonya Moore, female    DOB: 1970/09/26, 42 y.o.   MRN: [id number]  Chief Complaint  Patient presents with  . Hypertension   Medications, allergies, past medical history, surgical history, family history, social history and problem list reviewed and updated.  HPI  Exercise is a problem, so has joined a gym. Has already lost some weight and feels good about that. Eating is much better, but still needs to reduce portions. Is an emotional eater. Has friends with healthy eating and exercise habits. Often sabotages herself when she feels frustrated or angry.  Review of Systems No chest pain, SOB, HA, dizziness, vision change, N/V, diarrhea, constipation, dysuria, urinary urgency or frequency, myalgias, arthralgias or rash.     Objective:   Physical Exam  Blood pressure 160/96, pulse 86, temperature 98 F (36.7 C), temperature source Oral, resp. rate 16, height 5' 2.5" (1.588 m), weight 387 lb (175.542 kg), last menstrual period 02/20/2013, SpO2 99.00%. Body mass index is 69.61 kg/(m^2). Well-developed, well nourished morbidly obese WF who is awake, alert and oriented, in NAD. HEENT: [place]/AT, PERRL, EOMI.  Sclera and conjunctiva are clear.  EAC are patent, TMs are normal in appearance. Nasal mucosa is pink and moist. OP is clear. Neck: supple, non-tender, no lymphadenopathy, thyromegaly. Heart: RRR, no murmur Lungs: normal effort, CTA Extremities: no cyanosis, clubbing or edema. Skin: warm and dry without rash. Psychologic: good mood and appropriate affect, normal speech and behavior.       Assessment & Plan:  1. COPD (chronic obstructive pulmonary disease) Stable. Continue current treatment.  2. Hypertension Above goal.  Increase lisinopril from 10 mg to 20 mg. - lisinopril (PRINIVIL,ZESTRIL) 20 MG tablet; Take 1 tablet (20 mg total) by mouth daily.  Dispense: 90 tablet; Refill: 3 - Comprehensive metabolic panel  3. Obesity Extensive  counseling on healthy eating and exercise strategies.  4. Other abnormal glucose Weight loss and healthy lifestyle modification.  Consider metformin, pending A1C. - POCT glucose (manual entry) - POCT glycosylated hemoglobin (Hb A1C)   Fernande Bras, PA-C Physician Assistant-Certified [facility]

## 2013-03-06 NOTE — Patient Instructions (Addendum)
When you want to eat emotionally, try this:  Drink 8 ounces of water, go for a 15 minute walk, and then reassess. Do starve yourself.  Fruits and vegetables allow you to eat large volume, but get fewer calories. Consider measuring portions, to give yourself a realistic understanding of how much you are eating.  You can use up the 10 mg lisinopril tablets by taking 2 together each day, then start the 20 mg tablets.  I will contact you with your lab results as soon as they are available.   If you have not heard from me in 2 weeks, please contact me.  The fastest way to get your results is to register for My Chart (see the instructions on the last page of this printout).

## 2013-03-09 ENCOUNTER — Encounter: Payer: Self-pay | Admitting: Physician Assistant

## 2013-05-06 ENCOUNTER — Ambulatory Visit (INDEPENDENT_AMBULATORY_CARE_PROVIDER_SITE_OTHER): Payer: BC Managed Care – PPO | Admitting: Physician Assistant

## 2013-05-06 VITALS — BP 130/80 | HR 84 | Temp 98.3°F | Resp 16 | Ht 65.0 in | Wt 385.0 lb

## 2013-05-06 DIAGNOSIS — R5383 Other fatigue: Secondary | ICD-10-CM

## 2013-05-06 DIAGNOSIS — T783XXA Angioneurotic edema, initial encounter: Secondary | ICD-10-CM

## 2013-05-06 DIAGNOSIS — I10 Essential (primary) hypertension: Secondary | ICD-10-CM

## 2013-05-06 DIAGNOSIS — J329 Chronic sinusitis, unspecified: Secondary | ICD-10-CM

## 2013-05-06 DIAGNOSIS — R5381 Other malaise: Secondary | ICD-10-CM

## 2013-05-06 LAB — POCT CBC
GRANULOCYTE PERCENT: 70 % (ref 37–80)
HCT, POC: 43.5 % (ref 37.7–47.9)
HEMOGLOBIN: 13.4 g/dL (ref 12.2–16.2)
Lymph, poc: 2.6 (ref 0.6–3.4)
MCH, POC: 25.1 pg — AB (ref 27–31.2)
MCHC: 30.8 g/dL — AB (ref 31.8–35.4)
MCV: 81.7 fL (ref 80–97)
MID (cbc): 0.7 (ref 0–0.9)
MPV: 9.7 fL (ref 0–99.8)
POC GRANULOCYTE: 7.8 — AB (ref 2–6.9)
POC LYMPH PERCENT: 23.5 %L (ref 10–50)
POC MID %: 6.5 % (ref 0–12)
Platelet Count, POC: 421 10*3/uL (ref 142–424)
RBC: 5.33 M/uL (ref 4.04–5.48)
RDW, POC: 14.9 %
WBC: 11.1 10*3/uL — AB (ref 4.6–10.2)

## 2013-05-06 LAB — GLUCOSE, POCT (MANUAL RESULT ENTRY): POC Glucose: 125 mg/dl — AB (ref 70–99)

## 2013-05-06 MED ORDER — AMOXICILLIN 875 MG PO TABS: 1750.0000 mg | ORAL_TABLET | Freq: Two times a day (BID) | ORAL | Status: DC

## 2013-05-06 MED ORDER — LOSARTAN POTASSIUM 100 MG PO TABS: 100.0000 mg | ORAL_TABLET | Freq: Every day | ORAL | Status: DC

## 2013-05-06 MED ORDER — IPRATROPIUM BROMIDE 0.03 % NA SOLN: 2.0000 | Freq: Two times a day (BID) | NASAL | Status: DC

## 2013-05-06 NOTE — Progress Notes (Signed)
Subjective:    Patient ID: Tonya Moore, female    DOB: Sep 15, 1970, 43 y.o.   MRN: [id number]  HPI  Patient presents with 2 episodes of swollen tongue. First time was 10 days ago-she was wearing her CPAP mask. Second time was yesterday- she was not wearing CPAP mask. Tongue remains swollen for 2-3 hours. Reports tongue soreness and redness but does not think she has bitten her togue.   Had one episode of vomiting yesterday which she attributed to beginning her menstrual cycle. Experiences nausea and headaches the day before starts her period but has never vomited. Otherwise, new medication seems to be regulating her menstrual cycle well.   Has had nasal congestion, sore throat and cough since early January 2014. Has not been able to wear CPAP mask regularly due to congestion.   Has recently been experiencing excessive fatigue. All she wants to do is sleep. Also reports feeling of lightheadedness.    Review of Systems  Respiratory: Negative for shortness of breath and wheezing.   Gastrointestinal: Positive for nausea. Negative for blood in stool.  Genitourinary: Negative for dysuria and hematuria.      Objective:   Physical Exam  Constitutional: She is oriented to person, place, and time. Vital signs are normal. She appears well-developed and well-nourished.  HENT:  Head: Normocephalic and atraumatic.  Right Ear: Tympanic membrane, external ear and ear canal normal.  Left Ear: Tympanic membrane, external ear and ear canal normal.  Nose: Nose normal.  Mouth/Throat: Uvula is midline, oropharynx is clear and moist and mucous membranes are normal.  Eyes: Conjunctivae and lids are normal.  Neck: Normal range of motion. Neck supple.  Cardiovascular: Normal rate, regular rhythm and normal heart sounds.   Pulmonary/Chest: Effort normal and breath sounds normal.  Lymphadenopathy:       Head (right side): No submental, no submandibular, no tonsillar, no preauricular and no posterior  auricular adenopathy present.       Head (left side): No submental, no submandibular, no tonsillar, no preauricular and no posterior auricular adenopathy present.    She has no cervical adenopathy.       Right: No supraclavicular adenopathy present.       Left: No supraclavicular adenopathy present.  Neurological: She is alert and oriented to person, place, and time.  Skin: Skin is warm and dry.  Psychiatric: She has a normal mood and affect. Her behavior is normal. Judgment and thought content normal.   Results for orders placed in visit on 05/06/13  POCT CBC      Result Value Range   WBC 11.1 (*) 4.6 - 10.2 K/uL   Lymph, poc 2.6  0.6 - 3.4   POC LYMPH PERCENT 23.5  10 - 50 %L   MID (cbc) 0.7  0 - 0.9   POC MID % 6.5  0 - 12 %M   POC Granulocyte 7.8 (*) 2 - 6.9   Granulocyte percent 70.0  37 - 80 %G   RBC 5.33  4.04 - 5.48 M/uL   Hemoglobin 13.4  12.2 - 16.2 g/dL   HCT, POC 43.5  37.7 - 47.9 %   MCV 81.7  80 - 97 fL   MCH, POC 25.1 (*) 27 - 31.2 pg   MCHC 30.8 (*) 31.8 - 35.4 g/dL   RDW, POC 14.9     Platelet Count, POC 421  142 - 424 K/uL   MPV 9.7  0 - 99.8 fL  GLUCOSE, POCT (MANUAL RESULT ENTRY)  Result Value Range   POC Glucose 125 (*) 70 - 99 mg/dl       Assessment & Plan:   1. Angioedema Most likely due to lisinopril. Was increased to lisinopril 10 mg at last visit in October 2014. Will change HTN medication to losartan due to side effect. If experience angioedema again, take a dose of Benadryl and return to [facility] or ER.    2. Fatigue Most likely to due persistent sinus infection and infrequent use of CPAP machine. Will treat sinusitis and encouraged consistent use of CPAP machine. Will call will lab results. If no improvement with treatment of sinusitis and CPAP use, will re-evaluate fatigue in 6 weeks - POCT CBC - POCT glucose (manual entry) - TSH  3. Sinusitis Treating sinusitis with amoxicillin twice daily. Use atrovent spray twice daily for nasal  congestion - will help to improve compliance with CPAP machine.  - ipratropium (ATROVENT) 0.03 % nasal spray; Place 2 sprays into both nostrils 2 (two) times daily.  Dispense: 30 mL; Refill: 0 - amoxicillin (AMOXIL) 875 MG tablet; Take 2 tablets (1,750 mg total) by mouth 2 (two) times daily.  Dispense: 20 tablet; Refill: 0  4. HTN (hypertension) HTN is currently well controlled on Lisinopril 10 mg, however due to angioedema will d/c lisinopril and start losartan 100 mg daily. Will recheck BP in 6 weeks.  - losartan (COZAAR) 100 MG tablet; Take 1 tablet (100 mg total) by mouth daily.  Dispense: 90 tablet; Refill: 3

## 2013-05-06 NOTE — Patient Instructions (Signed)
Use the nasal spray as needed when you are congested, so that you can use the CPAP. Get back to the gym. If the swelling recurs, take a dose of Benadryl and seek evaluation, either here or in the Emergency Department. Stop the lisinopril. In it's place take losartan.

## 2013-05-07 LAB — TSH: TSH: 2.434 u[IU]/mL (ref 0.350–4.500)

## 2013-05-09 NOTE — Progress Notes (Signed)
I have examined this patient along with the student and agree.  The patient shares photos of the tongue during the most recent episode of swelling.  The LEFT side of the tongue was markedly swollen, with no involvement to the RIGHT of midline.

## 2013-12-03 ENCOUNTER — Other Ambulatory Visit: Payer: Self-pay | Admitting: Physician Assistant

## 2013-12-06 ENCOUNTER — Telehealth: Payer: BC Managed Care – PPO | Admitting: Physician Assistant

## 2013-12-06 DIAGNOSIS — G4733 Obstructive sleep apnea (adult) (pediatric): Secondary | ICD-10-CM

## 2013-12-06 DIAGNOSIS — Z9989 Dependence on other enabling machines and devices: Secondary | ICD-10-CM

## 2013-12-06 NOTE — Telephone Encounter (Signed)
Fax requesting current Rx for ALL CPAP supplies including mask, headgear, filters, tubing, cushoins and water chamber.  Order placed, but did not print.  Can it be called in? Form at TL desk.

## 2013-12-19 NOTE — Telephone Encounter (Signed)
Has this been done?

## 2013-12-23 NOTE — Telephone Encounter (Signed)
This has been completed. Clinical biochemist with [facility] is addressing these supplies and getting them delivered to the patient.

## 2014-01-22 ENCOUNTER — Other Ambulatory Visit: Payer: Self-pay | Admitting: Obstetrics & Gynecology

## 2014-01-22 DIAGNOSIS — N939 Abnormal uterine and vaginal bleeding, unspecified: Secondary | ICD-10-CM

## 2014-01-26 ENCOUNTER — Other Ambulatory Visit: Payer: Self-pay

## 2014-01-29 ENCOUNTER — Ambulatory Visit
Admission: RE | Admit: 2014-01-29 | Discharge: 2014-01-29 | Disposition: A | Discharge status: Home / Self Care | Payer: BC Managed Care – PPO | Source: Ambulatory Visit | Attending: Obstetrics & Gynecology | Admitting: Obstetrics & Gynecology

## 2014-01-29 DIAGNOSIS — N939 Abnormal uterine and vaginal bleeding, unspecified: Secondary | ICD-10-CM

## 2014-02-04 ENCOUNTER — Other Ambulatory Visit (HOSPITAL_COMMUNITY): Payer: Self-pay | Admitting: Obstetrics & Gynecology

## 2014-02-04 DIAGNOSIS — N939 Abnormal uterine and vaginal bleeding, unspecified: Secondary | ICD-10-CM

## 2014-03-23 ENCOUNTER — Ambulatory Visit (INDEPENDENT_AMBULATORY_CARE_PROVIDER_SITE_OTHER): Payer: BC Managed Care – PPO

## 2014-03-23 ENCOUNTER — Ambulatory Visit (INDEPENDENT_AMBULATORY_CARE_PROVIDER_SITE_OTHER): Payer: BC Managed Care – PPO | Admitting: Physician Assistant

## 2014-03-23 VITALS — BP 158/96 | HR 102 | Temp 98.8°F | Resp 20 | Ht 63.0 in | Wt 363.0 lb

## 2014-03-23 DIAGNOSIS — R05 Cough: Secondary | ICD-10-CM

## 2014-03-23 DIAGNOSIS — R5383 Other fatigue: Secondary | ICD-10-CM

## 2014-03-23 DIAGNOSIS — I10 Essential (primary) hypertension: Secondary | ICD-10-CM

## 2014-03-23 DIAGNOSIS — R079 Chest pain, unspecified: Secondary | ICD-10-CM

## 2014-03-23 DIAGNOSIS — G4733 Obstructive sleep apnea (adult) (pediatric): Secondary | ICD-10-CM

## 2014-03-23 DIAGNOSIS — J449 Chronic obstructive pulmonary disease, unspecified: Secondary | ICD-10-CM

## 2014-03-23 DIAGNOSIS — D649 Anemia, unspecified: Secondary | ICD-10-CM

## 2014-03-23 DIAGNOSIS — Z23 Encounter for immunization: Secondary | ICD-10-CM

## 2014-03-23 DIAGNOSIS — D473 Essential (hemorrhagic) thrombocythemia: Secondary | ICD-10-CM

## 2014-03-23 DIAGNOSIS — D75839 Thrombocytosis, unspecified: Secondary | ICD-10-CM

## 2014-03-23 DIAGNOSIS — R059 Cough, unspecified: Secondary | ICD-10-CM

## 2014-03-23 DIAGNOSIS — Z9989 Dependence on other enabling machines and devices: Secondary | ICD-10-CM

## 2014-03-23 DIAGNOSIS — D72829 Elevated white blood cell count, unspecified: Secondary | ICD-10-CM

## 2014-03-23 LAB — POCT CBC
GRANULOCYTE PERCENT: 80.8 % — AB (ref 37–80)
HCT, POC: 34.7 % — AB (ref 37.7–47.9)
Hemoglobin: 10.4 g/dL — AB (ref 12.2–16.2)
Lymph, poc: 2.3 (ref 0.6–3.4)
MCH, POC: 22 pg — AB (ref 27–31.2)
MCHC: 30.1 g/dL — AB (ref 31.8–35.4)
MCV: 73 fL — AB (ref 80–97)
MID (CBC): 0.3 (ref 0–0.9)
MPV: 7.7 fL (ref 0–99.8)
PLATELET COUNT, POC: 633 10*3/uL — AB (ref 142–424)
POC Granulocyte: 11.1 — AB (ref 2–6.9)
POC LYMPH %: 17.1 % (ref 10–50)
POC MID %: 2.1 %M (ref 0–12)
RBC: 4.75 M/uL (ref 4.04–5.48)
RDW, POC: 15.2 %
WBC: 13.7 10*3/uL — AB (ref 4.6–10.2)

## 2014-03-23 MED ORDER — FERROUS GLUCONATE 325 (36 FE) MG PO TABS: 1.0000 | ORAL_TABLET | Freq: Two times a day (BID) | ORAL | Status: DC

## 2014-03-23 MED ORDER — OMEPRAZOLE 40 MG PO CPDR: 40.0000 mg | DELAYED_RELEASE_CAPSULE | Freq: Every day | ORAL | Status: DC

## 2014-03-23 NOTE — Patient Instructions (Signed)
I'm concerned that you may be experiencing Laryngopharyngeal reflux (LPR) as a cause of your cough. Please take the omeprazole every day, and avoid the things that seem to make it worse.  Things that often make reflux symptoms worse: Caffeine Carbonation (soda) Spicy foods Acidic foods (like tomato sauce, orange juice, lemonade) Fatty foods (including whole milk and ice cream) Stress (feeling sad, worried, nervous) Nicotine Alcohol  Please use the CPAP. Not doing so contributes to your fatigue, elevated blood pressure and risk for a heart attack or heart failure. Please explore alternate sources of your CPAP supplies that may cost less.

## 2014-03-23 NOTE — Progress Notes (Signed)
Subjective:    Patient ID: Tonya Moore, female    DOB: [birth year], 43 y.o.   MRN: [id number]  PCP: Tonya Hinderman, PA-C  Chief Complaint  Patient presents with  . Chest Pain    x 2 days  . Cough    x 6 months; worse in the mornings    Allergies  Allergen Reactions  . Ace Inhibitors     Tongue swelling on Lisinopril    Patient Active Problem List   Diagnosis Date Noted  . Hypertension 01/01/2013  . OSA on CPAP   . Obesity   . COPD (chronic obstructive pulmonary disease)     Prior to Admission medications   Medication Sig Start Date End Date Taking? Authorizing Provider  losartan (COZAAR) 100 MG tablet Take 1 tablet (100 mg total) by mouth daily. 05/06/13  Yes Davontay Watlington S Francisca Harbuck, PA-C  albuterol (PROVENTIL HFA;VENTOLIN HFA) 108 (90 BASE) MCG/ACT inhaler Inhale 2 puffs into the lungs every 4 (four) hours as needed for wheezing (cough, shortness of breath or wheezing.). Patient not taking: Reported on 03/23/2014 01/01/13   Deonta Bomberger S Roddrick Sharron, PA-C  ipratropium (ATROVENT) 0.03 % nasal spray Place 2 sprays into both nostrils 2 (two) times daily. Patient not taking: Reported on 03/23/2014 05/06/13   Fara Chute, PA-C  medroxyPROGESTERone (PROVERA) 10 MG tablet  03/12/14   Historical Provider, MD  Multiple Vitamin (MULTIVITAMIN) tablet Take 1 tablet by mouth daily.    Historical Provider, MD    Medical, Surgical, Family and Social History reviewed and updated.  HPI  I last saw Tonya Moore in 05/2013.  Presents complaining of chest discomfort x 2 days. Has been coughing for about 6 months. Worst first thing in the mornings. Sputum is clear, so she hasn't been worried. She has a cat, and thinks she may be allergic, but isn't more congested than previously. A couple of days ago she used a bathroom cleaner in an aerosol and developed a clenching tightness in the chest and between her shoulder blades. It comes and goes, but worries her. She notes that she is sensitive to smells. No  SOB.  Stopped using the CPAP in 08/2013 due to the cost of replacing supplies. Has been sleeping propped up on the couch, "hoping that helps."  Has been having increased reflux, and finds that if she reduces the spicy and tomatoes she feels better.  Continues to complain of fatigue.  Doesn't think it's any worse since stopping the CPAP. Not motivated to exercise. Can't keep up with her fitter friends. Has tried to push herself but then "I pay for it for days and don't want to do it again."  Saw GYN October due to menorrhagia. Failed trial of COC. Progesterone stopped the bleeding, but she has had daily bleeding again since a progestin-free period in preparation for a follow-up pelvic ultrasound. Recommended repeat CBC due to recent thrombocytopenia. Has recommended an endometrial ablation-the patient goes back to discuss that in about 2 weeks.  Review of Systems As above. No dizziness, HA, neck or jaw pain. No vision change. No urinary urgency, frequency or burning.    Objective:   Physical Exam  Constitutional: She is oriented to person, place, and time. She appears well-developed and well-nourished. She is active and cooperative. No distress.  BP 158/96 mmHg  Pulse 102  Temp(Src) 98.8 F (37.1 C)  Resp 20  Ht 5\' 3"  (1.6 m)  Wt 363 lb (164.656 kg)  BMI 64.32 kg/m2  SpO2 99%  LMP 03/12/2014 Weight  is down 25 pounds in the past year.   HENT:  Head: Normocephalic and atraumatic.  Right Ear: Hearing, tympanic membrane, external ear and ear canal normal.  Left Ear: Hearing, tympanic membrane, external ear and ear canal normal.  Nose: Nose normal.  Mouth/Throat: Uvula is midline, oropharynx is clear and moist and mucous membranes are normal. No oropharyngeal exudate.  Eyes: Conjunctivae are normal.  Neck: Normal range of motion and full passive range of motion without pain. Neck supple. No thyroid mass and no thyromegaly present.  Cardiovascular: Normal rate, regular rhythm and normal  heart sounds.   No extrasystoles are present.  Pulses:      Radial pulses are 2+ on the right side, and 2+ on the left side.  Pulmonary/Chest: Effort normal.  Neurological: She is alert and oriented to person, place, and time.  Psychiatric: She has a normal mood and affect. Her speech is normal and behavior is normal.     Results for orders placed or performed in visit on 03/23/14  POCT CBC  Result Value Ref Range   WBC 13.7 (A) 4.6 - 10.2 K/uL   Lymph, poc 2.3 0.6 - 3.4   POC LYMPH PERCENT 17.1 10 - 50 %L   MID (cbc) 0.3 0 - 0.9   POC MID % 2.1 0 - 12 %M   POC Granulocyte 11.1 (A) 2 - 6.9   Granulocyte percent 80.8 (A) 37 - 80 %G   RBC 4.75 4.04 - 5.48 M/uL   Hemoglobin 10.4 (A) 12.2 - 16.2 g/dL   HCT, POC 34.7 (A) 37.7 - 47.9 %   MCV 73.0 (A) 80 - 97 fL   MCH, POC 22.0 (A) 27 - 31.2 pg   MCHC 30.1 (A) 31.8 - 35.4 g/dL   RDW, POC 15.2 %   Platelet Count, POC 633 (A) 142 - 424 K/uL   MPV 7.7 0 - 99.8 fL    EKG: reviewed with Dr. Joseph Art. No ischemia, dysrhythmia.  CXR: [facility] reading (PRIMARY) by  Dr. Joseph Art. No infiltrates or other acute process.       Assessment & Plan:  1. Chest pain, unspecified chest pain type Likely irritant, on COPD, exacerbated by anxiety once she started looking on line and thinking about her health. - EKG 12-Lead - DG Chest 2 View; Future  2. Cough Suspect LPR. Treat with PPI. Counseled on avoiding exacerbating factors. May need allergy treatment. - DG Chest 2 View; Future - omeprazole (PRILOSEC) 40 MG capsule; Take 1 capsule (40 mg total) by mouth daily.  Dispense: 30 capsule; Refill: 3  3. OSA on CPAP Stressed the importance of using CPAP. Prescription for supplies in hopes that reduces her cost. Needs to lose weight.  4. Other fatigue Suspect this is due in part to untreated OSA and deconditioning. Anemia may contribute, but less so. TSH was normal 05/2013.  5. Essential hypertension Above goal today. Suspect due to untreated  OSA and stress over her current pain. Monitor.  6. Chronic obstructive pulmonary disease, unspecified COPD, unspecified chronic bronchitis type Stable.  7. Need for influenza vaccination - Flu Vaccine QUAD 36+ mos IM  8. Thrombocytosis 9. Anemia, unspecified anemia type 10. Leucocytosis Likely due to persistent vaginal bleeding. Start iron supplementation. Proceed with consideration of endometrial ablation. Repeat CBC in 4 weeks. - POCT CBC - Ferrous Gluconate 325 (36 FE) MG TABS; Take 1 tablet by mouth 2 (two) times daily. Take with a stool softener  Dispense: 60 tablet; Refill: 5  Fara Chute, PA-C Physician Assistant-Certified [facility]

## 2014-03-25 ENCOUNTER — Telehealth: Payer: Self-pay | Admitting: Physician Assistant

## 2014-03-25 MED ORDER — BECLOMETHASONE DIPROPIONATE 80 MCG/ACT IN AERS: 2.0000 | INHALATION_SPRAY | Freq: Two times a day (BID) | RESPIRATORY_TRACT | Status: DC

## 2014-03-25 NOTE — Telephone Encounter (Signed)
Meds ordered this encounter  Medications  . beclomethasone (QVAR) 80 MCG/ACT inhaler    Sig: Inhale 2 puffs into the lungs 2 (two) times daily.    Dispense:  1 Inhaler    Refill:  12    Order Specific Question:  Supervising Provider    Answer:  DOOLITTLE, ROBERT P [[id number]]    Once the cough is improved, she can reduce to 1 puff BID, then 1 puff QD, then stop.  Plan to re-evaluate in 1 month.

## 2014-03-25 NOTE — Telephone Encounter (Signed)
Note pt was on an inhaler in the past. Do not see mention of new inhaler recommendation. Please advise

## 2014-03-25 NOTE — Telephone Encounter (Deleted)
Pt called wanting to add her cell number [phone]

## 2014-03-25 NOTE — Telephone Encounter (Signed)
Patient states she is not getting better since her last visit (03/23/2014). She is still having pressure in her chest and back and wants to know if the inhaler that she and Tonya Moore discussed could be sent to her pharmacy. [facility] on [address].  [phone] (work number)

## 2014-03-25 NOTE — Telephone Encounter (Signed)
Lm to advise pt- Rtn call to advise further instruction.

## 2014-03-25 NOTE — Telephone Encounter (Signed)
Pt called wanting to add her cell [phone]. Please call this number after 5.

## 2014-03-30 ENCOUNTER — Telehealth: Payer: Self-pay

## 2014-03-30 NOTE — Telephone Encounter (Signed)
Pt called wanting to know if she could take a 2nd omeprazole (PRILOSEC) 40 MG capsule [[id number]] for her back and chest pain. I told her to come in to be seen if she would like.Please advise at [phone]

## 2014-03-31 NOTE — Telephone Encounter (Signed)
She started prilosec Wednesday night and she feels like it has helped a little but she is still having the pain in her back and chest. She was wanting to know what else she can do or take. Can she take 2 of the prilosec. Please advise

## 2014-03-31 NOTE — Telephone Encounter (Signed)
Patient called again this evening. Says she took the Prilosec and got some rest and she feels much better. She no longer needs a call back. If she does feel bad again, she will see [name] tomorrow.

## 2014-05-09 ENCOUNTER — Other Ambulatory Visit: Payer: Self-pay | Admitting: Physician Assistant

## 2014-08-18 ENCOUNTER — Ambulatory Visit (INDEPENDENT_AMBULATORY_CARE_PROVIDER_SITE_OTHER): Payer: BLUE CROSS/BLUE SHIELD | Admitting: Physician Assistant

## 2014-08-18 VITALS — BP 140/92 | HR 97 | Temp 98.6°F | Resp 16 | Ht 63.5 in | Wt 356.4 lb

## 2014-08-18 DIAGNOSIS — I10 Essential (primary) hypertension: Secondary | ICD-10-CM

## 2014-08-18 DIAGNOSIS — Z0000 Encounter for general adult medical examination without abnormal findings: Secondary | ICD-10-CM

## 2014-08-18 DIAGNOSIS — Z114 Encounter for screening for human immunodeficiency virus [HIV]: Secondary | ICD-10-CM | POA: Diagnosis not present

## 2014-08-18 DIAGNOSIS — K130 Diseases of lips: Secondary | ICD-10-CM | POA: Diagnosis not present

## 2014-08-18 DIAGNOSIS — E669 Obesity, unspecified: Secondary | ICD-10-CM | POA: Diagnosis not present

## 2014-08-18 DIAGNOSIS — R739 Hyperglycemia, unspecified: Secondary | ICD-10-CM

## 2014-08-18 DIAGNOSIS — K219 Gastro-esophageal reflux disease without esophagitis: Secondary | ICD-10-CM | POA: Diagnosis not present

## 2014-08-18 DIAGNOSIS — D509 Iron deficiency anemia, unspecified: Secondary | ICD-10-CM | POA: Insufficient documentation

## 2014-08-18 DIAGNOSIS — E119 Type 2 diabetes mellitus without complications: Secondary | ICD-10-CM | POA: Insufficient documentation

## 2014-08-18 DIAGNOSIS — N921 Excessive and frequent menstruation with irregular cycle: Secondary | ICD-10-CM | POA: Diagnosis not present

## 2014-08-18 DIAGNOSIS — L989 Disorder of the skin and subcutaneous tissue, unspecified: Secondary | ICD-10-CM

## 2014-08-18 LAB — POCT GLYCOSYLATED HEMOGLOBIN (HGB A1C): Hemoglobin A1C: 11.5

## 2014-08-18 LAB — POCT UA - MICROSCOPIC ONLY
BACTERIA, U MICROSCOPIC: NEGATIVE
Casts, Ur, LPF, POC: NEGATIVE
Crystals, Ur, HPF, POC: NEGATIVE
MUCUS UA: NEGATIVE
RBC, URINE, MICROSCOPIC: NEGATIVE
Yeast, UA: NEGATIVE

## 2014-08-18 LAB — POCT CBC
Granulocyte percent: 68.3 %G (ref 37–80)
HEMATOCRIT: 36.6 % — AB (ref 37.7–47.9)
Hemoglobin: 11.2 g/dL — AB (ref 12.2–16.2)
Lymph, poc: 2.7 (ref 0.6–3.4)
MCH, POC: 23.1 pg — AB (ref 27–31.2)
MCHC: 30.7 g/dL — AB (ref 31.8–35.4)
MCV: 75.5 fL — AB (ref 80–97)
MID (cbc): 0.7 (ref 0–0.9)
MPV: 7.7 fL (ref 0–99.8)
POC Granulocyte: 7.4 — AB (ref 2–6.9)
POC LYMPH PERCENT: 25.3 %L (ref 10–50)
POC MID %: 6.4 %M (ref 0–12)
Platelet Count, POC: 515 10*3/uL — AB (ref 142–424)
RBC: 4.84 M/uL (ref 4.04–5.48)
RDW, POC: 17.1 %
WBC: 10.8 10*3/uL — AB (ref 4.6–10.2)

## 2014-08-18 LAB — POCT URINALYSIS DIPSTICK
Bilirubin, UA: NEGATIVE
Glucose, UA: 500
KETONES UA: NEGATIVE
Leukocytes, UA: NEGATIVE
Nitrite, UA: NEGATIVE
Protein, UA: NEGATIVE
Spec Grav, UA: 1.02
Urobilinogen, UA: 0.2
pH, UA: 6.5

## 2014-08-18 LAB — GLUCOSE, POCT (MANUAL RESULT ENTRY): POC Glucose: 307 mg/dl — AB (ref 70–99)

## 2014-08-18 MED ORDER — TRIAMCINOLONE ACETONIDE 0.025 % EX OINT: 1.0000 "application " | TOPICAL_OINTMENT | Freq: Two times a day (BID) | CUTANEOUS | Status: AC

## 2014-08-18 MED ORDER — OMEPRAZOLE 40 MG PO CPDR: 40.0000 mg | DELAYED_RELEASE_CAPSULE | Freq: Every day | ORAL | Status: DC

## 2014-08-18 MED ORDER — LOSARTAN POTASSIUM 100 MG PO TABS: 100.0000 mg | ORAL_TABLET | Freq: Every day | ORAL | Status: DC

## 2014-08-18 MED ORDER — METFORMIN HCL 1000 MG PO TABS: 1000.0000 mg | ORAL_TABLET | Freq: Two times a day (BID) | ORAL | Status: DC

## 2014-08-18 NOTE — Patient Instructions (Addendum)
Work on being a healthy you. Ask yourself, what would a healthy body choose?  Please increase the iron supplement to TWICE each day.  Start the metformin by taking 1/2 tablet twice each day. In one week, increase to the whole tablet twice each day.  If the lesions on the lip and scalp are improved but not resolved in 2 weeks, take 1 week off and then resume for 2 additional weeks. If they are still not resolved let me know, and I'll refer you to a dermatologist.

## 2014-08-18 NOTE — Progress Notes (Signed)
Patient ID: DAWT REEB, female    DOB: 10-08-1970, 44 y.o.   MRN: [id number]  PCP: Wynne Dust  Subjective:   Chief Complaint  Patient presents with  . Annual Exam    HPI  Presents for annual exam.   GYN with Dr. Nelda Marseille due to dysmenorrhea and heavy bleeding. Since her visit, the dysmenorrhea has improved somewhat. She wants to wait to have an ablation or hysterectomy. Hopes that weight loss will improve things even more. Not currently sexually active. Plans to consider contraception in addition to condoms before she engages in a new sexual relationship.  Lip lesion x several months. Scaled area adjacent to the LEFT upper lip. Was itchy initially. Applies Neosporin, but without resolution. Does not recall having blisters or pain at the site initially. No bleeding. Non-tender. Not red.   Scalp lesions, also x several months. Itchy. She picks at them and the persist, often tender, often bleeding.  Not interested in nutrition help yet, despite understanding that her weight increases her health risks in many areas..  Review of Systems Review of Systems  Constitutional: Negative.   HENT: Negative.   Eyes: Negative.   Respiratory: Negative.   Cardiovascular: Negative.   Gastrointestinal: Negative.   Endocrine: Positive for polydipsia and polyuria. Negative for cold intolerance, heat intolerance and polyphagia.  Genitourinary: Positive for frequency and menstrual problem. Negative for dysuria, urgency, hematuria, vaginal discharge and genital sores.  Musculoskeletal: Negative for myalgias and arthralgias.  Skin: Positive for wound (scalp and lip lesions).  Allergic/Immunologic: Negative.   Neurological: Negative for dizziness, weakness, light-headedness and headaches.  Hematological: Negative.   Psychiatric/Behavioral: Negative for suicidal ideas, sleep disturbance, dysphoric mood and decreased concentration. The patient is not nervous/anxious.        Patient  Active Problem List   Diagnosis Date Noted  . Esophageal reflux 08/18/2014  . Anemia, iron deficiency 08/18/2014  . Menometrorrhagia   . Hypertension 01/01/2013  . OSA on CPAP   . Obesity   . COPD (chronic obstructive pulmonary disease)     Prior to Admission medications   Medication Sig Start Date End Date Taking? Authorizing Provider  Ferrous Gluconate 325 (36 FE) MG TABS Take 1 tablet by mouth 2 (two) times daily. Take with a stool softener 03/23/14  Yes Christia Coaxum, PA-C  Multiple Vitamin (MULTIVITAMIN) tablet Take 1 tablet by mouth daily.   Yes Historical Provider, MD  omeprazole (PRILOSEC) 40 MG capsule Take 1 capsule (40 mg total) by mouth daily. 03/23/14  Yes Jasher Barkan, PA-C  losartan (COZAAR) 100 MG tablet TAKE 1 TABLET BY MOUTH DAILY Patient not taking: Reported on 08/18/2014 05/11/14   Harrison Mons, PA-C    Allergies  Allergen Reactions  . Ace Inhibitors     Tongue swelling on Lisinopril    Past Medical History  Diagnosis Date  . OSA on CPAP   . Obesity   . COPD (chronic obstructive pulmonary disease) 2005  . Anemia   . GERD (gastroesophageal reflux disease)   . Hypertension   . Menometrorrhagia      History reviewed. No pertinent past surgical history.  Family History  Problem Relation Age of Onset  . Cancer Mother     colorectal Ca  . Diabetes Mother     History   Social History  . Marital Status: Single    Spouse Name: n/a  . Number of Children: 0  . Years of Education: 16   Occupational History  . Tax adviser  Social History Main Topics  . Smoking status: Former Smoker    Types: Cigarettes    Quit date: 02/23/2004  . Smokeless tobacco: Never Used  . Alcohol Use: No     Comment: rare  . Drug Use: No  . Sexual Activity: No   Other Topics Concern  . None   Social History Narrative   Lives alone with her cat.         Objective:  Physical Exam  Physical Exam  Constitutional: She is oriented to person,  place, and time. Vital signs are normal. She appears well-developed and well-nourished. She is active and cooperative. No distress.  BP 140/92 mmHg  Pulse 97  Temp(Src) 98.6 F (37 C) (Oral)  Resp 16  Ht 5' 3.5" (1.613 m)  Wt 356 lb 6 oz (161.651 kg)  BMI 62.13 kg/m2  SpO2 98%  LMP 07/05/2014   HENT:  Head: Normocephalic and atraumatic.  Right Ear: Hearing, tympanic membrane, external ear and ear canal normal. No foreign bodies.  Left Ear: Hearing, tympanic membrane, external ear and ear canal normal. No foreign bodies.  Nose: Nose normal.  Mouth/Throat: Uvula is midline, oropharynx is clear and moist and mucous membranes are normal. No oral lesions. Normal dentition. No dental abscesses or uvula swelling. No oropharyngeal exudate.  Eyes: Conjunctivae, EOM and lids are normal. Pupils are equal, round, and reactive to light. Right eye exhibits no discharge. Left eye exhibits no discharge. No scleral icterus.  Fundoscopic exam:      The right eye shows no arteriolar narrowing, no AV nicking, no exudate, no hemorrhage and no papilledema. The right eye shows red reflex.       The left eye shows no arteriolar narrowing, no AV nicking, no exudate, no hemorrhage and no papilledema. The left eye shows red reflex.  Neck: Trachea normal, normal range of motion and full passive range of motion without pain. Neck supple. No spinous process tenderness and no muscular tenderness present. No thyroid mass and no thyromegaly present.  Cardiovascular: Normal rate, regular rhythm, normal heart sounds, intact distal pulses and normal pulses.   Pulmonary/Chest: Effort normal and breath sounds normal.  Musculoskeletal: She exhibits no edema or tenderness.       Cervical back: Normal.       Thoracic back: Normal.       Lumbar back: Normal.  Lymphadenopathy:       Head (right side): No tonsillar, no preauricular, no posterior auricular and no occipital adenopathy present.       Head (left side): No tonsillar,  no preauricular, no posterior auricular and no occipital adenopathy present.    She has no cervical adenopathy.       Right: No supraclavicular adenopathy present.       Left: No supraclavicular adenopathy present.  Neurological: She is alert and oriented to person, place, and time. She has normal strength and normal reflexes. No cranial nerve deficit. She exhibits normal muscle tone. Coordination and gait normal.  Skin: Skin is warm, dry and intact. Lesion noted. No rash noted. She is not diaphoretic. No cyanosis or erythema. Nails show no clubbing.  0.5 cm scaled, round area adjacent to the LEFT upper lip. Non-tender. No induration. Not erythematous.  Scattered excoriated lesions, 0.5-1.0 cm, some in clusters, of the scalp. Evidence of recent bleeding. No erythema. No induration. Minimally tender.  Psychiatric: She has a normal mood and affect. Her speech is normal and behavior is normal. Judgment and thought content normal.  Results for orders placed or performed in visit on 08/18/14  POCT CBC  Result Value Ref Range   WBC 10.8 (A) 4.6 - 10.2 K/uL   Lymph, poc 2.7 0.6 - 3.4   POC LYMPH PERCENT 25.3 10 - 50 %L   MID (cbc) 0.7 0 - 0.9   POC MID % 6.4 0 - 12 %M   POC Granulocyte 7.4 (A) 2 - 6.9   Granulocyte percent 68.3 37 - 80 %G   RBC 4.84 4.04 - 5.48 M/uL   Hemoglobin 11.2 (A) 12.2 - 16.2 g/dL   HCT, POC 36.6 (A) 37.7 - 47.9 %   MCV 75.5 (A) 80 - 97 fL   MCH, POC 23.1 (A) 27 - 31.2 pg   MCHC 30.7 (A) 31.8 - 35.4 g/dL   RDW, POC 17.1 %   Platelet Count, POC 515 (A) 142 - 424 K/uL   MPV 7.7 0 - 99.8 fL  POCT glucose (manual entry)  Result Value Ref Range   POC Glucose 307 (A) 70 - 99 mg/dl  POCT glycosylated hemoglobin (Hb A1C)  Result Value Ref Range   Hemoglobin A1C 11.5   POCT UA - Microscopic Only  Result Value Ref Range   WBC, Ur, HPF, POC 1-3    RBC, urine, microscopic neg    Bacteria, U Microscopic neg    Mucus, UA neg    Epithelial cells, urine per micros  0-3    Crystals, Ur, HPF, POC neg    Casts, Ur, LPF, POC neg    Yeast, UA neg   POCT urinalysis dipstick  Result Value Ref Range   Color, UA yellow    Clarity, UA clear    Glucose, UA 500    Bilirubin, UA neg    Ketones, UA neg    Spec Grav, UA 1.020    Blood, UA trace-intact    pH, UA 6.5    Protein, UA neg    Urobilinogen, UA 0.2    Nitrite, UA neg    Leukocytes, UA Negative        Assessment & Plan:  1. Annual physical exam Age appropriate anticipatory guidance provided.  2. Gastroesophageal reflux disease, esophagitis presence not specified Stable. Very diet dependent. PRN PPI.  3. Anemia, iron deficiency Minimal anemia, likely due to heavy menstrual flow. Not ready for ablation or hysterectomy. - POCT CBC  4. Essential hypertension Repeat BP is 140/92. Await CMET. Plan addition of low-dose ACEI due to new diagnosis of DM. - Comprehensive metabolic panel - TSH  5. Obesity Increase exercise. Improve healthy eating. Diabetes and nutrition education will help.  6. Menometrorrhagia Continue with GYN per their recommendations.  7. Screening for HIV (human immunodeficiency virus) - HIV antibody  8. Hyperglycemia New diagnosis of Diabetes. Anticipate elevated TG. Refer to Diabetes education.  - POCT glucose (manual entry) - POCT glycosylated hemoglobin (Hb A1C) - Lipid panel  9. Lip/Scalp lesions Unclear etiology. Trial of triamcinolone cream. May use x 2 weeks. May repeat with 1 week break. If lesions persist, refer to dermatology.   Fara Chute, PA-C Physician Assistant-Certified [facility]

## 2014-08-19 LAB — LIPID PANEL
CHOL/HDL RATIO: 4.2 ratio
Cholesterol: 155 mg/dL (ref 0–200)
HDL: 37 mg/dL — ABNORMAL LOW (ref >=46)
LDL Cholesterol: 90 mg/dL (ref 0–99)
Triglycerides: 140 mg/dL (ref <150)
VLDL: 28 mg/dL (ref 0–40)

## 2014-08-19 LAB — COMPREHENSIVE METABOLIC PANEL
ALK PHOS: 102 U/L (ref 39–117)
ALT: 15 U/L (ref 0–35)
AST: 15 U/L (ref 0–37)
Albumin: 3.4 g/dL — ABNORMAL LOW (ref 3.5–5.2)
BILIRUBIN TOTAL: 0.4 mg/dL (ref 0.2–1.2)
BUN: 16 mg/dL (ref 6–23)
CHLORIDE: 98 meq/L (ref 96–112)
CO2: 27 meq/L (ref 19–32)
Calcium: 9.3 mg/dL (ref 8.4–10.5)
Creat: 0.81 mg/dL (ref 0.50–1.10)
GLUCOSE: 285 mg/dL — AB (ref 70–99)
POTASSIUM: 4.7 meq/L (ref 3.5–5.3)
Sodium: 133 mEq/L — ABNORMAL LOW (ref 135–145)
Total Protein: 7 g/dL (ref 6.0–8.3)

## 2014-08-19 LAB — TSH: TSH: 1.812 u[IU]/mL (ref 0.350–4.500)

## 2014-08-19 LAB — HIV ANTIBODY (ROUTINE TESTING W REFLEX): HIV 1&2 Ab, 4th Generation: NONREACTIVE

## 2014-08-27 ENCOUNTER — Encounter: Payer: BLUE CROSS/BLUE SHIELD | Attending: Physician Assistant

## 2014-09-02 ENCOUNTER — Telehealth: Payer: Self-pay

## 2014-09-02 NOTE — Telephone Encounter (Signed)
Patient call reporting heavy bleeding since yesterday and thinks it is a normal menses. States that she feels some pulling in vaginal and abdominal area.  Patient reports fear that "something is trying to come out."  Patient also states that she has some "fluid" that is not blood. Patient wants to know, via phone, if symptoms are normal muscle spasms, hernia, or uterine prolapse.  Patient reassured to not panic and instructed to call office in am for appt.  Patient verbalized understanding and seemed appreciative of advice.

## 2014-09-03 ENCOUNTER — Ambulatory Visit: Payer: Self-pay

## 2014-09-03 ENCOUNTER — Encounter: Payer: Self-pay | Admitting: Physician Assistant

## 2014-09-10 ENCOUNTER — Ambulatory Visit: Payer: Self-pay

## 2014-11-05 ENCOUNTER — Encounter: Payer: Self-pay | Admitting: *Deleted

## 2015-02-24 ENCOUNTER — Ambulatory Visit (INDEPENDENT_AMBULATORY_CARE_PROVIDER_SITE_OTHER): Payer: BLUE CROSS/BLUE SHIELD | Admitting: Physician Assistant

## 2015-02-24 VITALS — BP 128/87 | HR 88 | Temp 98.0°F | Resp 18 | Ht 64.5 in | Wt 360.0 lb

## 2015-02-24 DIAGNOSIS — I10 Essential (primary) hypertension: Secondary | ICD-10-CM

## 2015-02-24 DIAGNOSIS — D509 Iron deficiency anemia, unspecified: Secondary | ICD-10-CM

## 2015-02-24 DIAGNOSIS — Z23 Encounter for immunization: Secondary | ICD-10-CM | POA: Diagnosis not present

## 2015-02-24 DIAGNOSIS — E119 Type 2 diabetes mellitus without complications: Secondary | ICD-10-CM | POA: Diagnosis not present

## 2015-02-24 LAB — POCT CBC
Granulocyte percent: 62 %G (ref 37–80)
HEMATOCRIT: 39.8 % (ref 37.7–47.9)
HEMOGLOBIN: 12.7 g/dL (ref 12.2–16.2)
LYMPH, POC: 3.6 — AB (ref 0.6–3.4)
MCH, POC: 24.7 pg — AB (ref 27–31.2)
MCHC: 32 g/dL (ref 31.8–35.4)
MCV: 77.1 fL — AB (ref 80–97)
MID (cbc): 0.6 (ref 0–0.9)
MPV: 8.1 fL (ref 0–99.8)
POC Granulocyte: 6.8 (ref 2–6.9)
POC LYMPH %: 33 % (ref 10–50)
POC MID %: 5 %M (ref 0–12)
Platelet Count, POC: 336 10*3/uL (ref 142–424)
RBC: 5.16 M/uL (ref 4.04–5.48)
RDW, POC: 17.5 %
WBC: 11 10*3/uL — AB (ref 4.6–10.2)

## 2015-02-24 LAB — GLUCOSE, POCT (MANUAL RESULT ENTRY): POC GLUCOSE: 243 mg/dL — AB (ref 70–99)

## 2015-02-24 LAB — POCT GLYCOSYLATED HEMOGLOBIN (HGB A1C): Hemoglobin A1C: 11

## 2015-02-24 LAB — HEMOGLOBIN A1C: Hgb A1c MFr Bld: 11 % — AB (ref 4.0–6.0)

## 2015-02-24 MED ORDER — CANAGLIFLOZIN 100 MG PO TABS: 100.0000 mg | ORAL_TABLET | Freq: Every day | ORAL | Status: DC

## 2015-02-24 NOTE — Progress Notes (Signed)
Patient ID: Tonya Moore, female    DOB: [birth year], 44 y.o.   MRN: [id number]  PCP: Wynne Dust  Subjective:   Chief Complaint  Patient presents with  . Hypertension    Discuss BP, has been elevated recently  . Follow-up    Glucose  . Flu Vaccine    HPI Presents for evaluation of diabetes and HTN.  Diabetes was diagnosed in May at her CPE. Metformin was started and she was to RTC in 3 months, but didn't until today. She has cut way back on starches in her diet, but isn't really exercising much. Still eats some sweets, but has switched to sugar-substitute for her coffee. Feeling more energy. Less short of breath with activity. Sleeping well. Managing stress in more healthy ways.  Doesn't check BP or glucose at home.  Had a tooth pulled yesterday, BP was up a little yesterday.  On medication review, it turns out that she never increased the metformin to 1000 mg BID after the initial week of 500 mg BID.   Review of Systems  Constitutional: Positive for activity change. Negative for fever, chills, diaphoresis, appetite change, fatigue and unexpected weight change.  HENT: Negative for congestion, ear pain, nosebleeds, postnasal drip, rhinorrhea, sinus pressure, sore throat and voice change.   Eyes: Negative for visual disturbance.  Respiratory: Positive for shortness of breath (but much improved). Negative for cough, chest tightness and wheezing.   Cardiovascular: Negative for chest pain, palpitations and leg swelling.  Gastrointestinal: Negative for nausea, vomiting, abdominal pain, diarrhea and constipation.  Endocrine: Positive for polydipsia and polyuria. Negative for cold intolerance, heat intolerance and polyphagia.  Genitourinary: Positive for frequency (and nocturia). Negative for dysuria, urgency and menstrual problem.  Musculoskeletal: Negative for myalgias, back pain, joint swelling, arthralgias, gait problem and neck pain.  Skin: Negative for rash.    Neurological: Negative for dizziness, weakness and headaches.  Hematological: Negative for adenopathy.       Patient Active Problem List   Diagnosis Date Noted  . Esophageal reflux 08/18/2014  . Anemia, iron deficiency 08/18/2014  . Type 2 diabetes mellitus without complication ([place]) [id number]  . Menometrorrhagia   . Hypertension 01/01/2013  . OSA on CPAP   . Obesity   . COPD (chronic obstructive pulmonary disease) ([place])      Prior to Admission medications   Medication Sig Start Date End Date Taking? Authorizing Provider  Ferrous Gluconate 325 (36 FE) MG TABS Take 1 tablet by mouth 2 (two) times daily. Take with a stool softener 03/23/14  Yes Marylon Verno, PA-C  losartan (COZAAR) 100 MG tablet Take 1 tablet (100 mg total) by mouth daily. 08/18/14  Yes Lakie Mclouth, PA-C  metFORMIN (GLUCOPHAGE) 1000 MG tablet Take 1 tablet (1,000 mg total) by mouth 2 (two) times daily with a meal. 08/18/14  Yes Min Tunnell, PA-C  Multiple Vitamin (MULTIVITAMIN) tablet Take 1 tablet by mouth daily.   Yes Historical Provider, MD  omeprazole (PRILOSEC) 40 MG capsule Take 1 capsule (40 mg total) by mouth daily. 08/18/14  Yes Abdallah Hern, PA-C  triamcinolone (KENALOG) 0.025 % ointment Apply 1 application topically 2 (two) times daily. 08/18/14  Yes Zeah Germano, PA-C  amoxicillin (AMOXIL) 500 MG capsule  02/23/15   Historical Provider, MD  HYDROcodone-acetaminophen [facility]) 10-325 MG tablet  02/23/15   Historical Provider, MD     Allergies  Allergen Reactions  . Ace Inhibitors     Tongue swelling on Lisinopril       Objective:  Physical Exam  Constitutional: She is oriented to person, place, and time. She appears well-developed and well-nourished. No distress.  BP 128/87 mmHg  Pulse 88  Temp(Src) 98 F (36.7 C) (Oral)  Resp 18  Ht 5' 4.5" (1.638 m)  Wt 360 lb (163.295 kg)  BMI 60.86 kg/m2  SpO2 99%  LMP 02/21/2015   Eyes: Conjunctivae are normal. No scleral icterus.  Neck:  Neck supple. No thyromegaly present.  Cardiovascular: Normal rate, regular rhythm, normal heart sounds and intact distal pulses.   Pulmonary/Chest: Effort normal and breath sounds normal.  Lymphadenopathy:    She has no cervical adenopathy.  Neurological: She is alert and oriented to person, place, and time.  Skin: Skin is warm and dry.  Psychiatric: She has a normal mood and affect. Her speech is normal and behavior is normal.       Results for orders placed or performed in visit on 02/24/15  POCT glucose (manual entry)  Result Value Ref Range   POC Glucose 243 (A) 70 - 99 mg/dl  POCT glycosylated hemoglobin (Hb A1C)  Result Value Ref Range   Hemoglobin A1C 11.0   POCT CBC  Result Value Ref Range   WBC 11.0 (A) 4.6 - 10.2 K/uL   Lymph, poc 3.6 (A) 0.6 - 3.4   POC LYMPH PERCENT 33.0 10 - 50 %L   MID (cbc) 0.6 0 - 0.9   POC MID % 5.0 0 - 12 %M   POC Granulocyte 6.8 2 - 6.9   Granulocyte percent 62.0 37 - 80 %G   RBC 5.16 4.04 - 5.48 M/uL   Hemoglobin 12.7 12.2 - 16.2 g/dL   HCT, POC 39.8 37.7 - 47.9 %   MCV 77.1 (A) 80 - 97 fL   MCH, POC 24.7 (A) 27 - 31.2 pg   MCHC 32.0 31.8 - 35.4 g/dL   RDW, POC 17.5 %   Platelet Count, POC 336 142 - 424 K/uL   MPV 8.1 0 - 99.8 fL       Assessment & Plan:   1. Type 2 diabetes mellitus without complication, without long-term current use of insulin (HCC) Uncontrolled. Misunderstanding about metformin dose. Increase to 1000 mg BID. Invokana already sent, so she'll hold that for now, but anticipate needing to start it at her next visit. Continue efforts for healthy eating and increasing exercise.  - POCT glucose (manual entry) - POCT glycosylated hemoglobin (Hb A1C) - Comprehensive metabolic panel - Lipid panel - canagliflozin (INVOKANA) 100 MG TABS tablet; Take 1 tablet (100 mg total) by mouth daily before breakfast.  Dispense: 90 tablet; Refill: 3  2. Anemia, iron deficiency Improved. Continue iron supplementation. - POCT  CBC  3. Essential hypertension Well controlled. Likely elevated yesterday due to dental pain and anticipation of procedure.  4. Need for prophylactic vaccination and inoculation against influenza - Flu Vaccine QUAD 36+ mos IM   Fara Chute, PA-C Physician Assistant-Certified [facility]

## 2015-02-26 LAB — COMPREHENSIVE METABOLIC PANEL
ALBUMIN: 3.6 g/dL (ref 3.6–5.1)
ALK PHOS: 92 U/L (ref 33–115)
ALT: 19 U/L (ref 6–29)
AST: 18 U/L (ref 10–35)
BILIRUBIN TOTAL: 0.6 mg/dL (ref 0.2–1.2)
BUN: 14 mg/dL (ref 7–25)
CALCIUM: 8.9 mg/dL (ref 8.6–10.2)
CO2: 24 mmol/L (ref 20–31)
Chloride: 97 mmol/L — ABNORMAL LOW (ref 98–110)
Creat: 0.72 mg/dL (ref 0.50–1.10)
Glucose, Bld: 217 mg/dL — ABNORMAL HIGH (ref 65–99)
Potassium: 4.3 mmol/L (ref 3.5–5.3)
Sodium: 133 mmol/L — ABNORMAL LOW (ref 135–146)
TOTAL PROTEIN: 7 g/dL (ref 6.1–8.1)

## 2015-02-26 LAB — LIPID PANEL
Cholesterol: 139 mg/dL (ref 125–200)
HDL: 36 mg/dL — ABNORMAL LOW (ref >=46)
LDL Cholesterol: 81 mg/dL (ref <130)
TRIGLYCERIDES: 110 mg/dL (ref <150)
Total CHOL/HDL Ratio: 3.9 Ratio (ref <=5.0)
VLDL: 22 mg/dL (ref <30)

## 2015-02-27 ENCOUNTER — Encounter: Payer: Self-pay | Admitting: Physician Assistant

## 2015-03-03 ENCOUNTER — Encounter: Payer: Self-pay | Admitting: Family Medicine

## 2015-06-14 IMAGING — CR DG CHEST 2V
2 series · 2 of 2 positions shown · non-contrast
Comparison: August 12, 2008

CLINICAL DATA: Six month history of cough ; 2 day history of chest
pain

EXAM:
CHEST  2 VIEW

[PA]
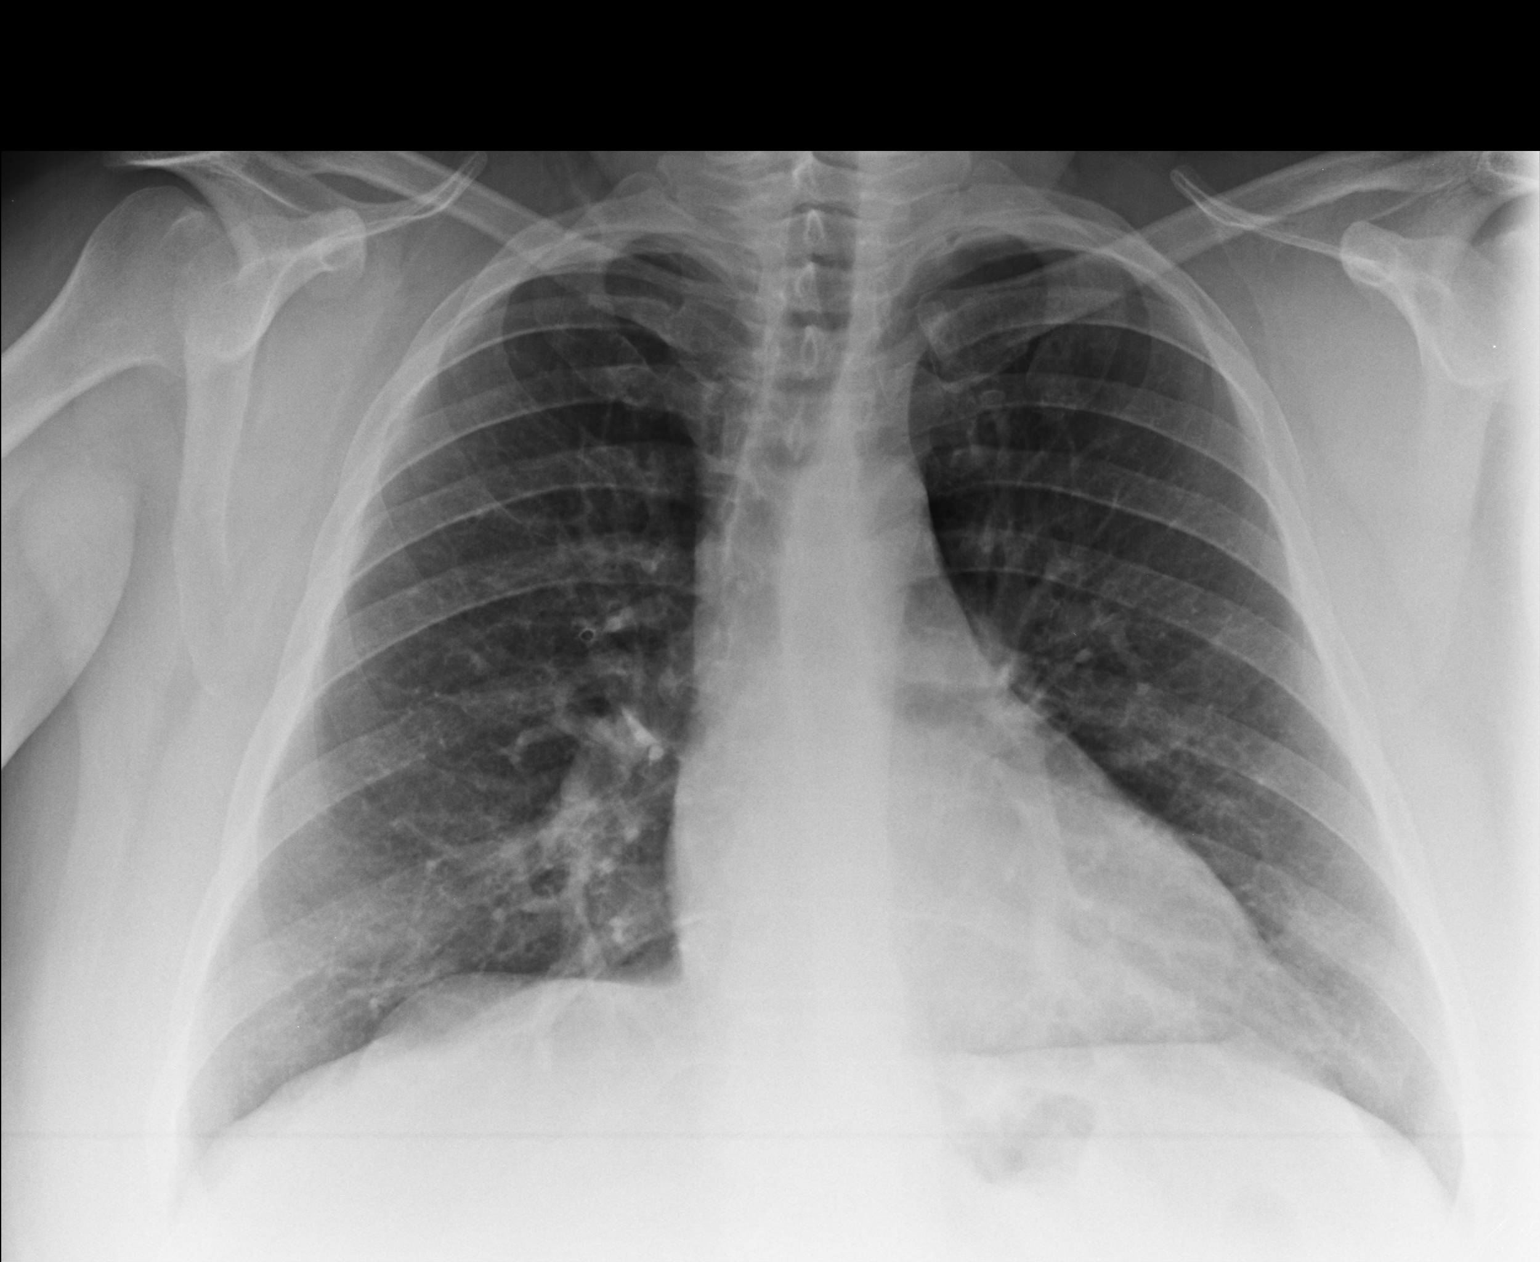

[lateral]
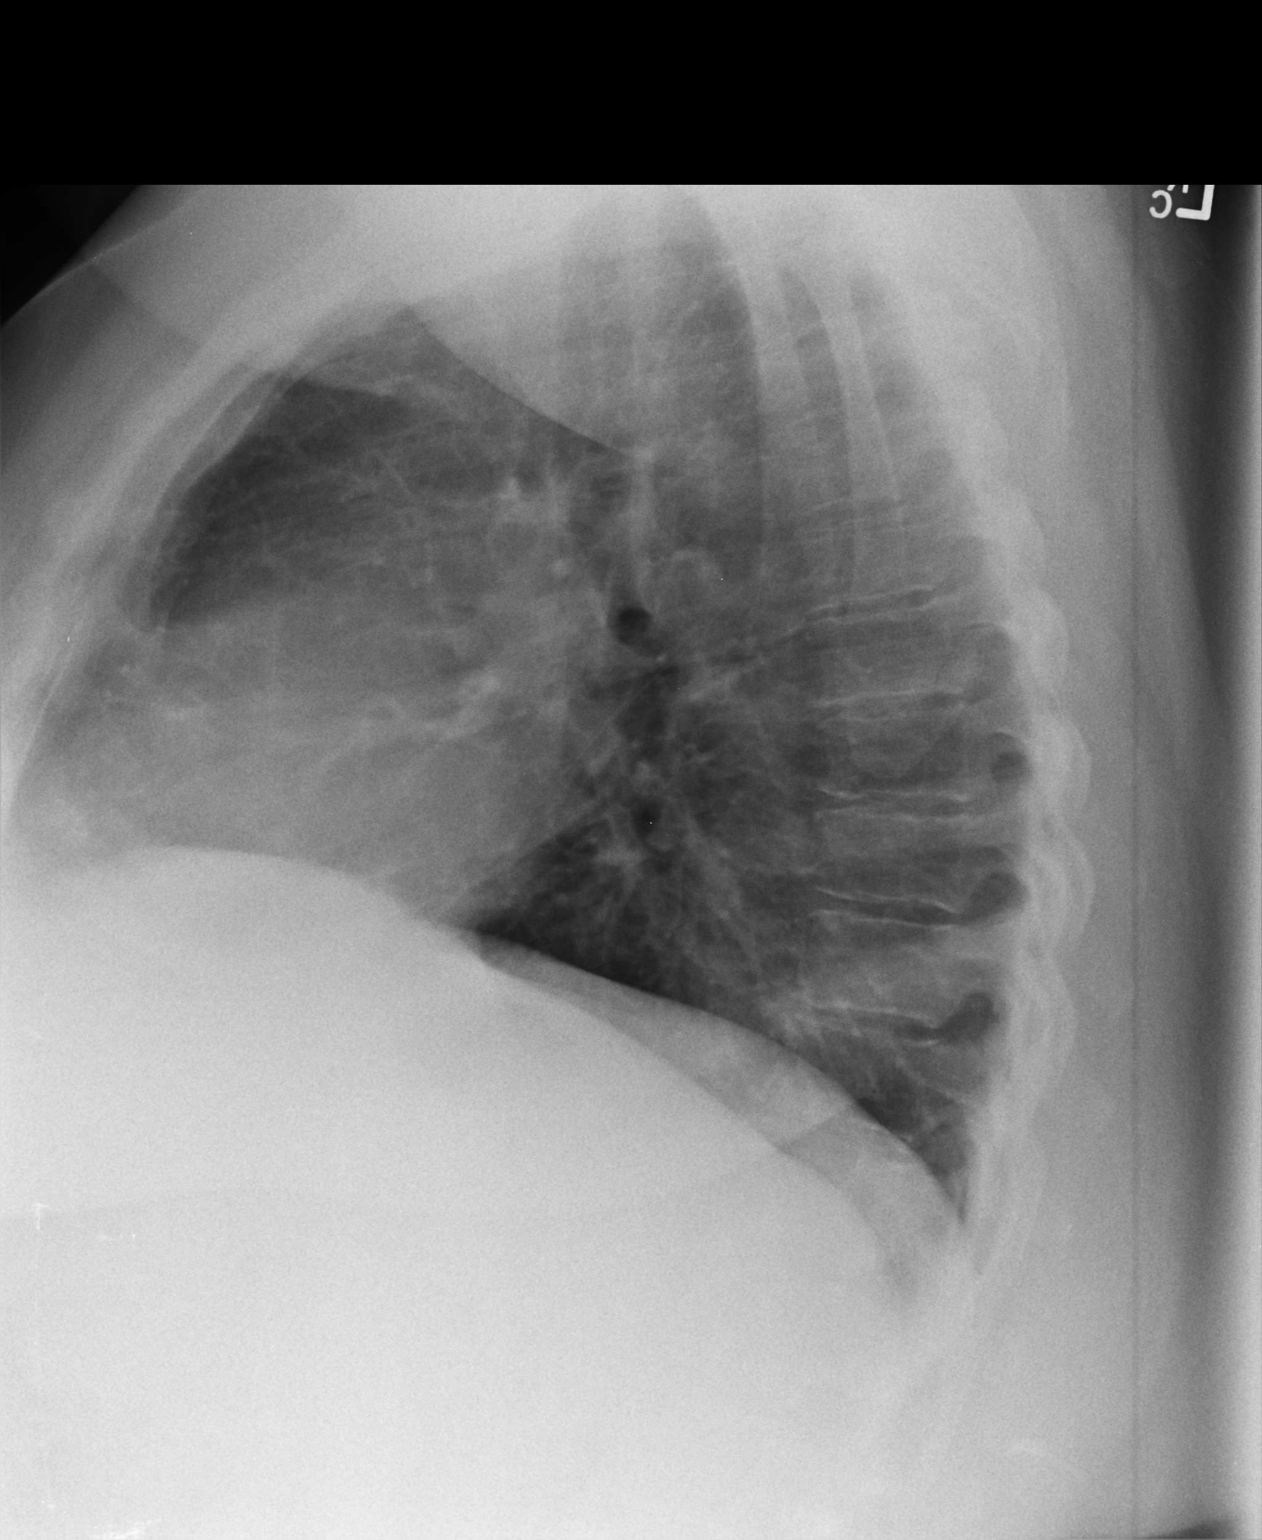

[2 of 2 positions shown; findings below may reference images not displayed]

FINDINGS: There is no edema or consolidation. The heart size and pulmonary
vascularity are normal. No adenopathy. No bone lesions. No
pneumothorax.
IMPRESSION: No edema or consolidation.

## 2015-08-01 ENCOUNTER — Other Ambulatory Visit: Payer: Self-pay | Admitting: Physician Assistant

## 2015-08-13 ENCOUNTER — Ambulatory Visit (INDEPENDENT_AMBULATORY_CARE_PROVIDER_SITE_OTHER): Payer: BLUE CROSS/BLUE SHIELD | Admitting: Physician Assistant

## 2015-08-13 VITALS — BP 130/78 | HR 86 | Temp 98.3°F | Resp 18 | Ht 64.5 in | Wt 354.0 lb

## 2015-08-13 DIAGNOSIS — Z23 Encounter for immunization: Secondary | ICD-10-CM

## 2015-08-13 DIAGNOSIS — I10 Essential (primary) hypertension: Secondary | ICD-10-CM | POA: Diagnosis not present

## 2015-08-13 DIAGNOSIS — K219 Gastro-esophageal reflux disease without esophagitis: Secondary | ICD-10-CM | POA: Diagnosis not present

## 2015-08-13 DIAGNOSIS — E119 Type 2 diabetes mellitus without complications: Secondary | ICD-10-CM

## 2015-08-13 DIAGNOSIS — D509 Iron deficiency anemia, unspecified: Secondary | ICD-10-CM | POA: Diagnosis not present

## 2015-08-13 LAB — POCT GLYCOSYLATED HEMOGLOBIN (HGB A1C): Hemoglobin A1C: 13.2

## 2015-08-13 LAB — POCT CBC
Granulocyte percent: 71.6 %G (ref 37–80)
HCT, POC: 40.5 % (ref 37.7–47.9)
HEMOGLOBIN: 13.9 g/dL (ref 12.2–16.2)
Lymph, poc: 2.9 (ref 0.6–3.4)
MCH, POC: 36.8 pg — AB (ref 27–31.2)
MCHC: 34.5 g/dL (ref 31.8–35.4)
MCV: 77.9 fL — AB (ref 80–97)
MID (cbc): 0.5 (ref 0–0.9)
MPV: 8.2 fL (ref 0–99.8)
POC GRANULOCYTE: 8.7 — AB (ref 2–6.9)
POC LYMPH PERCENT: 24 %L (ref 10–50)
POC MID %: 4.4 % (ref 0–12)
Platelet Count, POC: 393 10*3/uL (ref 142–424)
RBC: 5.2 M/uL (ref 4.04–5.48)
RDW, POC: 15.2 %
WBC: 12.1 10*3/uL — AB (ref 4.6–10.2)

## 2015-08-13 LAB — GLUCOSE, POCT (MANUAL RESULT ENTRY): POC Glucose: 353 mg/dl — AB (ref 70–99)

## 2015-08-13 MED ORDER — LOSARTAN POTASSIUM 100 MG PO TABS: ORAL_TABLET | ORAL | Status: DC

## 2015-08-13 MED ORDER — CANAGLIFLOZIN 100 MG PO TABS: 100.0000 mg | ORAL_TABLET | Freq: Every day | ORAL | Status: DC

## 2015-08-13 MED ORDER — METFORMIN HCL 1000 MG PO TABS: 1000.0000 mg | ORAL_TABLET | Freq: Two times a day (BID) | ORAL | Status: DC

## 2015-08-13 MED ORDER — OMEPRAZOLE 40 MG PO CPDR
40.0000 mg | DELAYED_RELEASE_CAPSULE | Freq: Every day | ORAL | Status: DC
Start: 2015-08-13 — End: 2016-11-21

## 2015-08-13 NOTE — Patient Instructions (Addendum)
[employer], [employer], [employer], [place]  1. Try stopping the Multivitamin. If the stomach issues go away, restart it. If they come back, we know that is the culprit.  If they do not resolve, you can restart the multivitamin and let me know.  2. Continue your efforts for healthier eating choices and getting more exercise. Have fun doing it, experimenting with new recipes and trying out new activities.  3. Please continue the metformin and losartan. Please ADD Invokana. You need to drink plenty of water while you take Invokana, especially during the first two weeks.   4. Please schedule a visit with your eye specialist.      IF you received an x-ray today, you will receive an invoice from [facility]. Please contact [facility] at [phone] with questions or concerns regarding your invoice.   IF you received labwork today, you will receive an invoice from [employer]. Please contact [facility] at [phone] with questions or concerns regarding your invoice.   Our billing staff will not be able to assist you with questions regarding bills from these companies.  You will be contacted with the lab results as soon as they are available. The fastest way to get your results is to activate your My Chart account. Instructions are located on the last page of this paperwork. If you have not heard from [place] regarding the results in 2 weeks, please contact this office.

## 2015-08-13 NOTE — Progress Notes (Signed)
Patient ID: Tonya Moore, female    DOB: November 11, 1970, 45 y.o.   MRN: [id number]  PCP: Wynne Dust  Subjective:   Chief Complaint  Patient presents with  . Follow-up    was told to return for a repeat of blood work  . Follow-up    blood pressure recheck    HPI Presents for evaluation of diabetes and HTN.  I last saw her 6 months ago. She's been following up less often than recommended, perhaps in a bit of denial about the seriousness of uncontrolled diabetes. With the addition of metformin about a year ago, when she was diagnosed, and some minimal changes in her diet, she has seen some increased energy and improved endurance, and overall happier mindset.  Today she notes episodic and various skin irritations, especially on the scalp. Can be anywhere-nose, face, takes a long time to resolve. Not particularly painful. No identified triggers.   Increased activity with warmer weather, also has some friends who are working on healthier lifestyles.  The MVI she's been taking is irritating her stomach a bit-she needs the omeprazole BID again. The Rx Fe did not bother her.  Has given up meat, though she eats eggs and fish.  Review of Systems  Constitutional: Negative for activity change, appetite change, fatigue and unexpected weight change.  HENT: Negative for congestion, dental problem, ear pain, hearing loss, mouth sores, postnasal drip, rhinorrhea, sneezing, sore throat, tinnitus and trouble swallowing.   Eyes: Negative for photophobia, pain, redness and visual disturbance.  Respiratory: Negative for cough, chest tightness and shortness of breath.   Cardiovascular: Negative for chest pain, palpitations and leg swelling.  Gastrointestinal: Negative for nausea, vomiting, abdominal pain, diarrhea, constipation and blood in stool.  Endocrine: Negative for cold intolerance, heat intolerance, polydipsia, polyphagia and polyuria.  Genitourinary: Negative for dysuria, urgency,  frequency and hematuria.  Musculoskeletal: Negative for myalgias, arthralgias, gait problem and neck stiffness.  Skin: Positive for wound (episodic, slow-healing "places" ). Negative for rash.  Neurological: Negative for dizziness, speech difficulty, weakness, light-headedness, numbness and headaches.  Hematological: Negative for adenopathy.  Psychiatric/Behavioral: Negative for confusion and sleep disturbance. The patient is not nervous/anxious.        Patient Active Problem List   Diagnosis Date Noted  . Esophageal reflux 08/18/2014  . Anemia, iron deficiency 08/18/2014  . Type 2 diabetes mellitus without complication ([place]) Q000111Q  . Menometrorrhagia   . Hypertension 01/01/2013  . OSA on CPAP   . Obesity   . COPD (chronic obstructive pulmonary disease) ([place])      Prior to Admission medications   Medication Sig Start Date End Date Taking? Authorizing Provider  canagliflozin (INVOKANA) 100 MG TABS tablet Take 1 tablet (100 mg total) by mouth daily before breakfast. 02/24/15  NO Dailynn Nancarrow, PA-C  Ferrous Gluconate 325 (36 FE) MG TABS Take 1 tablet by mouth 2 (two) times daily. Take with a stool softener 03/23/14  NO Armonie Staten, PA-C  losartan (COZAAR) 100 MG tablet TAKE 1 TABLET(100 MG) BY MOUTH DAILY 08/03/15  Yes Pankaj Haack, PA-C  metFORMIN (GLUCOPHAGE) 1000 MG tablet Take 1 tablet (1,000 mg total) by mouth 2 (two) times daily with a meal. 08/18/14  Yes Rashea Hoskie, PA-C  Multiple Vitamin (MULTIVITAMIN) tablet Take 1 tablet by mouth daily.   Yes Historical Provider, MD  omeprazole (PRILOSEC) 40 MG capsule Take 1 capsule (40 mg total) by mouth daily. 08/18/14  Yes Diesel Lina, PA-C  triamcinolone (KENALOG) 0.025 % ointment Apply 1 application  topically 2 (two) times daily. 08/18/14  Yes Harrison Mons, PA-C     Allergies  Allergen Reactions  . Ace Inhibitors     Tongue swelling on Lisinopril       Objective:  Physical Exam  Constitutional: She is oriented  to person, place, and time. She appears well-developed and well-nourished. She is active and cooperative. No distress.  BP 138/90 mmHg  Pulse 86  Temp(Src) 98.3 F (36.8 C) (Oral)  Resp 18  Ht 5' 4.5" (1.638 m)  Wt 354 lb (160.573 kg)  BMI 59.85 kg/m2  SpO2 98%  LMP 07/12/2015  HENT:  Head: Normocephalic and atraumatic.  Right Ear: Hearing normal.  Left Ear: Hearing normal.  Eyes: Conjunctivae are normal. No scleral icterus.  Neck: Normal range of motion. Neck supple. No thyromegaly present.  Cardiovascular: Normal rate, regular rhythm and normal heart sounds.   Pulses:      Radial pulses are 2+ on the right side, and 2+ on the left side.  Pulmonary/Chest: Effort normal and breath sounds normal.  Lymphadenopathy:       Head (right side): No tonsillar, no preauricular, no posterior auricular and no occipital adenopathy present.       Head (left side): No tonsillar, no preauricular, no posterior auricular and no occipital adenopathy present.    She has no cervical adenopathy.       Right: No supraclavicular adenopathy present.       Left: No supraclavicular adenopathy present.  Neurological: She is alert and oriented to person, place, and time. No sensory deficit.  Skin: Skin is warm, dry and intact. Lesion noted. No rash noted. No cyanosis or erythema. Nails show no clubbing.  Scattered excoriations, in various stages of healing on the tip of the nose, scalp, arms and legs.  Psychiatric: She has a normal mood and affect. Her speech is normal and behavior is normal.       Results for orders placed or performed in visit on 08/13/15  POCT CBC  Result Value Ref Range   WBC 12.1 (A) 4.6 - 10.2 K/uL   Lymph, poc 2.9 0.6 - 3.4   POC LYMPH PERCENT 24.0 10 - 50 %L   MID (cbc) 0.5 0 - 0.9   POC MID % 4.4 0 - 12 %M   POC Granulocyte 8.7 (A) 2 - 6.9   Granulocyte percent 71.6 37 - 80 %G   RBC 5.20 4.04 - 5.48 M/uL   Hemoglobin 13.9 12.2 - 16.2 g/dL   HCT, POC 40.5 37.7 - 47.9 %    MCV 77.9 (A) 80 - 97 fL   MCH, POC 36.8 (A) 27 - 31.2 pg   MCHC 34.5 31.8 - 35.4 g/dL   RDW, POC 15.2 %   Platelet Count, POC 393 142 - 424 K/uL   MPV 8.2 0 - 99.8 fL  POCT glucose (manual entry)  Result Value Ref Range   POC Glucose 353 (A) 70 - 99 mg/dl  POCT glycosylated hemoglobin (Hb A1C)  Result Value Ref Range   Hemoglobin A1C 13.2        Assessment & Plan:   1. Type 2 diabetes mellitus without complication, without long-term current use of insulin (HCC) Uncontrolled. Worse than last visit. Again discussed the importance of making lifestyle changes, taking medications as directed and following up. Again she declines diabetes education and nutrition counseling, stating that she knows what to do, and that now she is motivated to actually do it. Continue metformin. Add Invokana. Re-evaluate  in 3 months. - POCT glucose (manual entry) - POCT glycosylated hemoglobin (Hb A1C) - Microalbumin, urine - metFORMIN (GLUCOPHAGE) 1000 MG tablet; Take 1 tablet (1,000 mg total) by mouth 2 (two) times daily with a meal.  Dispense: 180 tablet; Refill: 3 - canagliflozin (INVOKANA) 100 MG TABS tablet; Take 1 tablet (100 mg total) by mouth daily before breakfast.  Dispense: 90 tablet; Refill: 3  2. Essential hypertension On the border of acceptable. She requests that we make no changes this time, and knows that we will add another agent at her visit in 3 months if she's inadequately controlled. - TSH - losartan (COZAAR) 100 MG tablet; TAKE 1 TABLET(100 MG) BY MOUTH DAILY  Dispense: 90 tablet; Refill: 3  3. Anemia, iron deficiency Normal hgb today. Continue supplement. - POCT CBC  4. Gastroesophageal reflux disease, esophagitis presence not specified - omeprazole (PRILOSEC) 40 MG capsule; Take 1 capsule (40 mg total) by mouth daily.  Dispense: 90 capsule; Refill: 3  5. Need for pneumococcal vaccination - Pneumococcal polysaccharide vaccine 23-valent greater than or equal to 2yo  subcutaneous/IM   Fara Chute, PA-C Physician Assistant-Certified [facility] [place] [facility]

## 2015-08-14 LAB — TSH: TSH: 2.83 m[IU]/L

## 2015-08-14 LAB — MICROALBUMIN, URINE: MICROALB UR: 6.3 mg/dL

## 2015-08-18 ENCOUNTER — Encounter: Payer: Self-pay | Admitting: Physician Assistant

## 2015-08-23 ENCOUNTER — Telehealth: Payer: Self-pay | Admitting: Physician Assistant

## 2015-08-23 NOTE — Telephone Encounter (Signed)
Patient stated she is not able to get Invokana 100 MG filled. Patient stated if it's neccessary then she want to speak with [name] about a generic medication. Patient request to call her at work [phone]. After 5pm please call [phone].

## 2015-08-24 NOTE — Telephone Encounter (Signed)
[name], Wilder Glade is on the formulary and I have a coupon for free medication. Can you write Rx?

## 2015-08-25 MED ORDER — DAPAGLIFLOZIN PROPANEDIOL 5 MG PO TABS: 5.0000 mg | ORAL_TABLET | Freq: Every day | ORAL | Status: DC

## 2015-08-25 NOTE — Telephone Encounter (Signed)
Meds ordered this encounter  Medications  . dapagliflozin propanediol (FARXIGA) 5 MG TABS tablet    Sig: Take 5 mg by mouth daily.    Dispense:  30 tablet    Refill:  3    Order Specific Question:  Supervising Provider    Answer:  DOOLITTLE, ROBERT P [id number]

## 2015-08-26 NOTE — Telephone Encounter (Signed)
Called pt, LMOM to CB. 

## 2015-08-31 NOTE — Telephone Encounter (Signed)
SPoke with pt, she is concerned about taking another medication anyway. She has switched her diet and lifestyle. She stop eating breads, sugar, she walks, and just wants to see if she can try this for 3 months without taking the medication. Please advise.

## 2015-08-31 NOTE — Telephone Encounter (Signed)
Ok. I'll see her in 3 months.

## 2015-08-31 NOTE — Telephone Encounter (Signed)
Left message for pt to call back  °

## 2015-09-01 NOTE — Telephone Encounter (Signed)
Lm advising pt.

## 2016-02-22 ENCOUNTER — Ambulatory Visit: Payer: BLUE CROSS/BLUE SHIELD | Admitting: Physician Assistant

## 2016-03-02 ENCOUNTER — Other Ambulatory Visit: Payer: Self-pay | Admitting: Physician Assistant

## 2016-03-02 DIAGNOSIS — K219 Gastro-esophageal reflux disease without esophagitis: Secondary | ICD-10-CM

## 2016-03-05 NOTE — Telephone Encounter (Signed)
Last ov 08/2015 needs visit

## 2016-04-11 ENCOUNTER — Ambulatory Visit: Payer: BLUE CROSS/BLUE SHIELD | Admitting: Physician Assistant

## 2016-04-25 ENCOUNTER — Encounter: Payer: Self-pay | Admitting: Physician Assistant

## 2016-04-25 ENCOUNTER — Ambulatory Visit (INDEPENDENT_AMBULATORY_CARE_PROVIDER_SITE_OTHER): Payer: BLUE CROSS/BLUE SHIELD | Admitting: Physician Assistant

## 2016-04-25 ENCOUNTER — Telehealth: Payer: Self-pay | Admitting: Physician Assistant

## 2016-04-25 VITALS — BP 125/85 | HR 90 | Temp 99.5°F | Resp 16 | Ht 63.05 in | Wt 327.4 lb

## 2016-04-25 DIAGNOSIS — I10 Essential (primary) hypertension: Secondary | ICD-10-CM

## 2016-04-25 DIAGNOSIS — Z6841 Body Mass Index (BMI) 40.0 and over, adult: Secondary | ICD-10-CM

## 2016-04-25 DIAGNOSIS — E119 Type 2 diabetes mellitus without complications: Secondary | ICD-10-CM

## 2016-04-25 DIAGNOSIS — R350 Frequency of micturition: Secondary | ICD-10-CM

## 2016-04-25 DIAGNOSIS — R319 Hematuria, unspecified: Secondary | ICD-10-CM | POA: Diagnosis not present

## 2016-04-25 DIAGNOSIS — Z23 Encounter for immunization: Secondary | ICD-10-CM

## 2016-04-25 DIAGNOSIS — R109 Unspecified abdominal pain: Secondary | ICD-10-CM | POA: Diagnosis not present

## 2016-04-25 DIAGNOSIS — D509 Iron deficiency anemia, unspecified: Secondary | ICD-10-CM | POA: Diagnosis not present

## 2016-04-25 LAB — POCT URINALYSIS DIP (MANUAL ENTRY)
Glucose, UA: >=1000 — AB
LEUKOCYTES UA: NEGATIVE
NITRITE UA: NEGATIVE
PH UA: 5.5
Spec Grav, UA: >=1.03
Urobilinogen, UA: 0.2

## 2016-04-25 MED ORDER — METFORMIN HCL ER 500 MG PO TB24: 500.0000 mg | ORAL_TABLET | Freq: Every day | ORAL | 2 refills | Status: DC

## 2016-04-25 NOTE — Progress Notes (Signed)
Patient ID: Tonya Moore, female    DOB: 10-19-1970, 46 y.o.   MRN: [id number]  PCP: Harrison Mons, PA-C  Chief Complaint  Patient presents with  . Urinary Frequency    WITH PRESS x 3-4 weeks  . Abdominal Pain    WITH DISCOMFORT  . Hematuria    Subjective:   Presents for evaluation of urinary urgency, frequency, burning and hematuria x 3-4 weeks.  I last saw her in 08/2015, and prior to that 02/2015, 08/2014. She has diabetes and is chronically non-compliant with treatment recommendations. Last A1C was 13.2. Since then, she has stopped both metformin and Invokana, initially stating that she just didn't want to take medications and then relating that the metformin caused GI upset and the Invokana was too costly. She has been working on lowering her stress, which she thought would result in improvement in her glucose. Diabetes education classes were to cost $500, her portion, and therefore not accessible to her. Reports increased thirst, urinary frequency, lower abdominal pain x 1 month. Also fatigue, blurred vision, paresthesias in both feet. Occasional headaches. Some feeling "out of sorts." No SOB, CP, wheezing. No rash.  She did not proceed with hysterectomy or endometrial ablation recommended by GYN because she doesn't like the idea of surgery. She wonders if her current symptoms are related to endometriosis..    Review of Systems As above.    Patient Active Problem List   Diagnosis Date Noted  . Esophageal reflux 08/18/2014  . Anemia, iron deficiency 08/18/2014  . Type 2 diabetes mellitus without complication ([place]) [id number]  . Menometrorrhagia   . Hypertension 01/01/2013  . OSA on CPAP   . Obesity   . COPD (chronic obstructive pulmonary disease) ([place])      Prior to Admission medications   Medication Sig Start Date End Date Taking? Authorizing Provider  Ferrous Gluconate 325 (36 FE) MG TABS Take 1 tablet by mouth 2 (two) times daily. Take with a stool  softener 03/23/14  Yes Taeden Geller, PA-C  losartan (COZAAR) 100 MG tablet TAKE 1 TABLET(100 MG) BY MOUTH DAILY 08/13/15  Yes Shiori Adcox, PA-C  Multiple Vitamin (MULTIVITAMIN) tablet Take 1 tablet by mouth daily.   Yes Historical Provider, MD  omeprazole (PRILOSEC) 40 MG capsule Take 1 capsule (40 mg total) by mouth daily. 08/13/15  Yes Leasia Swann, PA-C  triamcinolone (KENALOG) 0.025 % ointment Apply 1 application topically 2 (two) times daily. 08/18/14  Yes Treyce Spillers, PA-C  dapagliflozin propanediol (FARXIGA) 5 MG TABS tablet Take 5 mg by mouth daily. Patient not taking: Reported on 04/25/2016 08/25/15   Harrison Mons, PA-C  metFORMIN (GLUCOPHAGE) 1000 MG tablet Take 1 tablet (1,000 mg total) by mouth 2 (two) times daily with a meal. Patient not taking: Reported on 04/25/2016 08/13/15   Harrison Mons, PA-C  omeprazole (PRILOSEC) 40 MG capsule TAKE 1 CAPSULE(40 MG) BY MOUTH DAILY 03/05/16   Harrison Mons, PA-C     Allergies  Allergen Reactions  . Ace Inhibitors     Tongue swelling on Lisinopril       Objective:  Physical Exam  Constitutional: She is oriented to person, place, and time. She appears well-developed and well-nourished. She is active and cooperative. No distress.  BP (!) 120/100 (BP Location: Left Arm, Patient Position: Sitting, Cuff Size: Large)   Pulse 90   Temp 99.5 F (37.5 C) (Oral)   Resp 16   Ht 5' 3.05" (1.601 m)   Wt (!) 327 lb 6.4 oz (  148.5 kg)   LMP 04/21/2016   SpO2 97%   BMI 57.90 kg/m   HENT:  Head: Normocephalic and atraumatic.  Right Ear: Hearing normal.  Left Ear: Hearing normal.  Eyes: Conjunctivae are normal. No scleral icterus.  Neck: Normal range of motion. Neck supple. No thyromegaly present.  Cardiovascular: Normal rate, regular rhythm and normal heart sounds.   Pulses:      Radial pulses are 2+ on the right side, and 2+ on the left side.  Pulmonary/Chest: Effort normal and breath sounds normal.  Lymphadenopathy:       Head  (right side): No tonsillar, no preauricular, no posterior auricular and no occipital adenopathy present.       Head (left side): No tonsillar, no preauricular, no posterior auricular and no occipital adenopathy present.    She has no cervical adenopathy.       Right: No supraclavicular adenopathy present.       Left: No supraclavicular adenopathy present.  Neurological: She is alert and oriented to person, place, and time. No sensory deficit.  Skin: Skin is warm, dry and intact. No rash noted. No cyanosis or erythema. Nails show no clubbing.  Psychiatric: She has a normal mood and affect. Her speech is normal and behavior is normal.    Diabetic Foot Exam - Simple   Simple Foot Form Diabetic Foot exam was performed with the following findings:  Yes 04/25/2016  4:51 PM  Visual Inspection No deformities, no ulcerations, no other skin breakdown bilaterally:  Yes Sensation Testing Intact to touch and monofilament testing bilaterally:  Yes Pulse Check Comments     Results for orders placed or performed in visit on 04/25/16  POCT urinalysis dipstick  Result Value Ref Range   Color, UA brown (A) yellow   Clarity, UA hazy (A) clear   Glucose, UA >=1,000 (A) negative   Bilirubin, UA small (A) negative   Ketones, POC UA small (15) (A) negative   Spec Grav, UA >=1.030    Blood, UA large (A) negative   pH, UA 5.5    Protein Ur, POC =100 (A) negative   Urobilinogen, UA 0.2    Nitrite, UA Negative Negative   Leukocytes, UA Negative Negative    Repeat BP 125/85      Assessment & Plan:   1. Urinary frequency 2. Abdominal pain, unspecified abdominal location 3. Hematuria, unspecified type Frequency likely due to wildly uncontrolled diabetes. Await microscopy and UCx regarding abdominal pain and hematuria. - POCT urinalysis dipstick - Urine culture - Urine Microscopic  4. Type 2 diabetes mellitus without complication, without long-term current use of insulin (HCC) Lengthy discussion  of the effects of uncontrolled diabetes, including significant morbidity and mortality. Again she commits to "doing better," and following up. Trial of extended release metformin, starting at only 500 mg. If she tolerates that formulation, will try to increase to 2000 mg daily. However, expect the need to add additional agent(s) to gain control. - Hemoglobin A1c - Comprehensive metabolic panel - Lipid panel - Microalbumin, urine - HM DIABETES EYE EXAM - HM DIABETES FOOT EXAM - metFORMIN (GLUCOPHAGE-XR) 500 MG 24 hr tablet; Take 1 tablet (500 mg total) by mouth daily with breakfast.  Dispense: 30 tablet; Refill: 2  5. Iron deficiency anemia, unspecified iron deficiency anemia type Update CBC - CBC with Differential/Platelet  6. Essential hypertension Controlled on recheck. - Comprehensive metabolic panel  7. Need for influenza vaccination - Flu Vaccine QUAD 36+ mos IM  8. Need for Tdap  vaccination - Tdap vaccine greater than or equal to 7yo IM  9. Body mass index (bmi) 50-59.9 , adult (HCC) Agrees to medical weight loss management. - Amb Ref to Medical Weight Management   Return in about 3 months (around 07/24/2016) for re-evaluation and fasting labs.   Fara Chute, PA-C Physician Assistant-Certified [facility] [place]

## 2016-04-25 NOTE — Patient Instructions (Addendum)
Please schedule an eye exam: [facility]  [address] [place], [place] [address]  Phone: [phone]  [facility] [place] [facility], [place],  [address]  Phone: [phone]  [place] [address], [place],  [address]  Phone: [phone]   TAKE the Metformin XR 500 mg one time daily with a meal. If the GI upset recurs, LET ME KNOW. We will try something else.   IF you received an x-ray today, you will receive an invoice from [facility]. Please contact [facility] at [phone] with questions or concerns regarding your invoice.   IF you received labwork today, you will receive an invoice from [facility]. Please contact [facility] at [phone] with questions or concerns regarding your invoice.   Our billing staff will not be able to assist you with questions regarding bills from these companies.  You will be contacted with the lab results as soon as they are available. The fastest way to get your results is to activate your My Chart account. Instructions are located on the last page of this paperwork. If you have not heard from [place] regarding the results in 2 weeks, please contact this office.

## 2016-04-25 NOTE — Progress Notes (Signed)
Patient ID: Tonya Moore, female    DOB: 05-17-1970, 46 y.o.   MRN: [id number]  PCP: Harrison Mons, PA-C  Chief Complaint  Patient presents with  . Urinary Frequency    WITH PRESS x 3-4 weeks  . Abdominal Pain    WITH DISCOMFORT  . Hematuria    Subjective:   Presents for evaluation of urinary frequency, hematuria, and lower abdominal pain.  Pt is a 46yo caucasian female who presents with urinary frequency, hematuria, and abdominal pain x 1 month. She was last seen here on 08/13/15 and, at that time, had a Hg A1C of 13.2 on Metformin 1,000mg  BID and Invokana 100mg  QD. Today, she states that she stopped taking her diabetes medication on 08/14/15 because the metformin caused GI upset and the Invokana was too expensive. She also did not fill her prescription for Farxiga as a replacement for the more expensive Invokana. Sh estates that she thought that her high blood sugar was due to stress and thought that managing her stress would bring her BG down. She has not followed up with any providers since then.   Today, she states that she has had increased thirst, urinary frequency, and lower abdominal pain x 1 month. She has a history of heavy periods and it was recommend by her OB/Gyn that she undergo a hysterectomy with endometrial ablation, but she states that she "doesn't like surgery and doesn't think it's necessary". She states that she was unsure whether these new symptoms were to do with her endometriosis or a bladder infection/UTI.  Pt also admits to fatigue, blurry vision, tingling and numbness in her feet bilaterally, occasional headaches, and feeling "out of sorts" and "confused".   Pt is reluctant to talk about the possible affect of her uncontrolled diabetes on her body and is reluctant to take medications that may have side effects, but concedes that changes need to be made to prevent damage to her eyes, kidneys, and nervous system.  Review of Systems In addition to that  stated in HPI above: Const: Denies fever, chills, or weight changes. Pulm: Denies cough or SOB. CV: Denies chest pain or palpitations. Abd: Denies abdominal pain, nausea, vomiting, diarrhea, or constipation.  Skin: Denies rash.  Patient Active Problem List   Diagnosis Date Noted  . Esophageal reflux 08/18/2014  . Anemia, iron deficiency 08/18/2014  . Type 2 diabetes mellitus without complication ([place]) [id number]  . Menometrorrhagia   . Hypertension 01/01/2013  . OSA on CPAP   . Obesity   . COPD (chronic obstructive pulmonary disease) ([place])      Prior to Admission medications   Medication Sig Start Date End Date Taking? Authorizing Provider  Ferrous Gluconate 325 (36 FE) MG TABS Take 1 tablet by mouth 2 (two) times daily. Take with a stool softener 03/23/14  Yes [name] Jeffery, PA-C  losartan (COZAAR) 100 MG tablet TAKE 1 TABLET(100 MG) BY MOUTH DAILY 08/13/15  Yes [name] Jeffery, PA-C  Multiple Vitamin (MULTIVITAMIN) tablet Take 1 tablet by mouth daily.   Yes Historical Provider, MD  omeprazole (PRILOSEC) 40 MG capsule Take 1 capsule (40 mg total) by mouth daily. 08/13/15  Yes [name] Jeffery, PA-C  triamcinolone (KENALOG) 0.025 % ointment Apply 1 application topically 2 (two) times daily. 08/18/14  Yes [name] Jeffery, PA-C  dapagliflozin propanediol (FARXIGA) 5 MG TABS tablet Take 5 mg by mouth daily. Patient not taking: Reported on 04/25/2016 08/25/15   Harrison Mons, PA-C  metFORMIN (GLUCOPHAGE) 1000 MG tablet Take 1 tablet (  1,000 mg total) by mouth 2 (two) times daily with a meal. Patient not taking: Reported on 04/25/2016 08/13/15   Harrison Mons, PA-C  omeprazole (PRILOSEC) 40 MG capsule TAKE 1 CAPSULE(40 MG) BY MOUTH DAILY 03/05/16   Harrison Mons, PA-C     Allergies  Allergen Reactions  . Ace Inhibitors     Tongue swelling on Lisinopril       Objective:  Physical Exam HEENT: PERRLA Pulm: Good respiratory effort. CTAB. No wheezes, rales, or rhonchi. CV: RRR. No  M/R/G Abd: Moderate tenderness to palpation of lower abdomen. Soft. Nondistended. +BS x 4 quadrants.     Results for orders placed or performed in visit on 04/25/16  POCT urinalysis dipstick  Result Value Ref Range   Color, UA brown (A) yellow   Clarity, UA hazy (A) clear   Glucose, UA >=1,000 (A) negative   Bilirubin, UA small (A) negative   Ketones, POC UA small (15) (A) negative   Spec Grav, UA >=1.030    Blood, UA large (A) negative   pH, UA 5.5    Protein Ur, POC =100 (A) negative   Urobilinogen, UA 0.2    Nitrite, UA Negative Negative   Leukocytes, UA Negative Negative    Assessment & Plan:   1. Urinary frequency Possibly due to uncontrolled diabetes. Will check urine culture to verify. Meanwhile, will seek to control diabetes. - POCT urinalysis dipstick  2. Abdominal pain, unspecified abdominal location  3. Hematuria, unspecified type - Urine culture  4. Type 2 diabetes mellitus without complication, without long-term current use of insulin (HCC) Pt advised to begin Metformin ER at 500mg  daily and to contact provider if GI symptoms arise again. Pt advised to follow up in 3 months for reevaluation of diabetes. - Hemoglobin A1c - Comprehensive metabolic panel - Lipid panel - Microalbumin, urine - HM DIABETES EYE EXAM - HM DIABETES FOOT EXAM - metFORMIN (GLUCOPHAGE-XR) 500 MG 24 hr tablet; Take 1 tablet (500 mg total) by mouth daily with breakfast.  Dispense: 30 tablet; Refill: 2  5. Iron deficiency anemia, unspecified iron deficiency anemia type - CBC with Differential/Platelet  6. Essential hypertension Pt advised to continue medications as directed. - Comprehensive metabolic panel  7. Need for influenza vaccination - Flu Vaccine QUAD 47+ mos IM  8. Need for Tdap vaccination - Tdap vaccine greater than or equal to 47yo IM  9. Body mass index (bmi) 50-59.9 , adult (HCC) - Amb Ref to Medical Weight Management  Lorella Nimrod, PA-S

## 2016-04-26 LAB — CBC WITH DIFFERENTIAL/PLATELET
BASOS: 1 %
Basophils Absolute: 0.1 10*3/uL (ref 0.0–0.2)
EOS (ABSOLUTE): 0.2 10*3/uL (ref 0.0–0.4)
Eos: 2 %
HEMOGLOBIN: 14.2 g/dL (ref 11.1–15.9)
Hematocrit: 43.4 % (ref 34.0–46.6)
IMMATURE GRANS (ABS): 0 10*3/uL (ref 0.0–0.1)
Immature Granulocytes: 0 %
LYMPHS: 27 %
Lymphocytes Absolute: 2.5 10*3/uL (ref 0.7–3.1)
MCH: 25 pg — AB (ref 26.6–33.0)
MCHC: 32.7 g/dL (ref 31.5–35.7)
MCV: 77 fL — AB (ref 79–97)
MONOCYTES: 5 %
Monocytes Absolute: 0.4 10*3/uL (ref 0.1–0.9)
NEUTROS ABS: 6.2 10*3/uL (ref 1.4–7.0)
Neutrophils: 65 %
Platelets: 505 10*3/uL — ABNORMAL HIGH (ref 150–379)
RBC: 5.67 x10E6/uL — ABNORMAL HIGH (ref 3.77–5.28)
RDW: 15.5 % — ABNORMAL HIGH (ref 12.3–15.4)
WBC: 9.4 10*3/uL (ref 3.4–10.8)

## 2016-04-26 LAB — COMPREHENSIVE METABOLIC PANEL
ALT: 21 IU/L (ref 0–32)
AST: 14 IU/L (ref 0–40)
Albumin/Globulin Ratio: 1.2 (ref 1.2–2.2)
Albumin: 4 g/dL (ref 3.5–5.5)
Alkaline Phosphatase: 111 IU/L (ref 39–117)
BUN/Creatinine Ratio: 26 — ABNORMAL HIGH (ref 9–23)
BUN: 17 mg/dL (ref 6–24)
Bilirubin Total: 0.6 mg/dL (ref 0.0–1.2)
CO2: 23 mmol/L (ref 18–29)
CREATININE: 0.66 mg/dL (ref 0.57–1.00)
Calcium: 9.4 mg/dL (ref 8.7–10.2)
Chloride: 93 mmol/L — ABNORMAL LOW (ref 96–106)
GFR calc Af Amer: 123 mL/min/{1.73_m2} (ref >59)
GFR calc non Af Amer: 106 mL/min/{1.73_m2} (ref >59)
GLOBULIN, TOTAL: 3.3 g/dL (ref 1.5–4.5)
Glucose: 294 mg/dL — ABNORMAL HIGH (ref 65–99)
Potassium: 4.4 mmol/L (ref 3.5–5.2)
SODIUM: 134 mmol/L (ref 134–144)
Total Protein: 7.3 g/dL (ref 6.0–8.5)

## 2016-04-26 LAB — LIPID PANEL
CHOLESTEROL TOTAL: 178 mg/dL (ref 100–199)
Chol/HDL Ratio: 4.7 ratio units — ABNORMAL HIGH (ref 0.0–4.4)
HDL: 38 mg/dL — AB (ref >39)
LDL CALC: 104 mg/dL — AB (ref 0–99)
TRIGLYCERIDES: 179 mg/dL — AB (ref 0–149)
VLDL Cholesterol Cal: 36 mg/dL (ref 5–40)

## 2016-04-26 LAB — URINALYSIS, MICROSCOPIC ONLY: CASTS: NONE SEEN /LPF

## 2016-04-26 LAB — HEMOGLOBIN A1C

## 2016-04-26 LAB — MICROALBUMIN, URINE: Microalbumin, Urine: 222.7 ug/mL

## 2016-04-26 NOTE — Telephone Encounter (Signed)
Reviewed results of UA with patient. UCx is pending. Will need repeat UA to verify resolution of hematuria, but no evidence of infection. Her symptoms are most likely due to uncontrolled diabetes.

## 2016-04-28 LAB — URINE CULTURE

## 2016-04-29 ENCOUNTER — Telehealth: Payer: Self-pay | Admitting: Radiology

## 2016-04-29 MED ORDER — AMOXICILLIN 875 MG PO TABS: 875.0000 mg | ORAL_TABLET | Freq: Two times a day (BID) | ORAL | 0 refills | Status: DC

## 2016-04-29 NOTE — Telephone Encounter (Signed)
-----   Message from Harrison Mons, [place] sent at 04/28/2016  5:33 PM EST ----- Please call this patient. 1. UCx reveals some bacteria, which likely explains the blood in the urine, and may contribute to the urinary symptoms she has (though the primary problem is the uncontrolled diabetes). Please send in: Amoxicillin 875, 1 PO BID x 7 days, #14, no refills. 2. Diabetes has worsened again, A1C is now above 15.5%, up from 13.2% in May. Normal is <5.6%. If she is not tolerating the metformin xr, I need to know so that we can go ahead and switch her to something else.

## 2016-04-29 NOTE — Telephone Encounter (Signed)
I have advised patient. She has told me she is using the Metformin XR and is tolerating well. She states she is also working on her diet. I have advised her of the antibiotic and the need for her to improve her A1C, she has voiced understanding.   To you FYI

## 2016-04-29 NOTE — Telephone Encounter (Signed)
Sent in meds. Left message for patient to call us back so we can advise.

## 2016-05-07 LAB — HM DIABETES EYE EXAM

## 2016-06-20 DIAGNOSIS — G4733 Obstructive sleep apnea (adult) (pediatric): Secondary | ICD-10-CM | POA: Diagnosis not present

## 2016-07-24 ENCOUNTER — Other Ambulatory Visit: Payer: Self-pay | Admitting: Physician Assistant

## 2016-07-24 DIAGNOSIS — E119 Type 2 diabetes mellitus without complications: Secondary | ICD-10-CM

## 2016-07-25 ENCOUNTER — Ambulatory Visit (INDEPENDENT_AMBULATORY_CARE_PROVIDER_SITE_OTHER): Payer: BLUE CROSS/BLUE SHIELD | Admitting: Physician Assistant

## 2016-07-25 ENCOUNTER — Encounter: Payer: Self-pay | Admitting: Physician Assistant

## 2016-07-25 VITALS — BP 120/84 | HR 88 | Temp 98.4°F | Resp 16 | Ht 66.0 in | Wt 322.6 lb

## 2016-07-25 DIAGNOSIS — E119 Type 2 diabetes mellitus without complications: Secondary | ICD-10-CM

## 2016-07-25 DIAGNOSIS — D509 Iron deficiency anemia, unspecified: Secondary | ICD-10-CM | POA: Diagnosis not present

## 2016-07-25 DIAGNOSIS — G4733 Obstructive sleep apnea (adult) (pediatric): Secondary | ICD-10-CM

## 2016-07-25 DIAGNOSIS — J449 Chronic obstructive pulmonary disease, unspecified: Secondary | ICD-10-CM | POA: Diagnosis not present

## 2016-07-25 DIAGNOSIS — Z6841 Body Mass Index (BMI) 40.0 and over, adult: Secondary | ICD-10-CM

## 2016-07-25 DIAGNOSIS — R319 Hematuria, unspecified: Secondary | ICD-10-CM

## 2016-07-25 DIAGNOSIS — I10 Essential (primary) hypertension: Secondary | ICD-10-CM | POA: Diagnosis not present

## 2016-07-25 DIAGNOSIS — E785 Hyperlipidemia, unspecified: Secondary | ICD-10-CM

## 2016-07-25 LAB — POCT URINALYSIS DIP (MANUAL ENTRY)
BILIRUBIN UA: NEGATIVE
Glucose, UA: 250 mg/dL — AB
Nitrite, UA: NEGATIVE
PH UA: 5.5 (ref 5.0–8.0)
Protein Ur, POC: NEGATIVE mg/dL
Spec Grav, UA: 1.015 (ref 1.010–1.025)
Urobilinogen, UA: 0.2 E.U./dL

## 2016-07-25 MED ORDER — METFORMIN HCL ER 500 MG PO TB24: 500.0000 mg | ORAL_TABLET | Freq: Every day | ORAL | 2 refills | Status: DC

## 2016-07-25 NOTE — Progress Notes (Signed)
Patient ID: Tonya Moore, female    DOB: 04/28/1970, 46 y.o.   MRN: [id number]  PCP: Harrison Mons, PA-C  Chief Complaint  Patient presents with  . Follow-up    Subjective:   Presents for evaluation of uncontrolled diabetes.  She is tolerating the resumption of metformin, the extended-release formulation. She did not schedule at [facility], electing instead to use that time and money at the gym. She is "trying to" exercise for "at least an hour," 2-3 times/week. Doing a combination of strength and aerobic training. In addition, she is making healthier eating choices.  "As long as things are improving, I don't really want to add to my regimen." "My idea of quality of life is not to come to the doctor every three months for the rest of my life for no benefit." Believes that "all this focus on illness makes [place] ill." Doesn't want to become "dependent" on medications like statins.  Urinary symptoms have resolved. Vision is improved. Feels so much better, less fatigue. No CP, SOB, HA, dizziness. No nausea, vomiting.   Review of Systems  Constitutional: Negative for activity change, appetite change, fatigue and unexpected weight change.  HENT: Negative for congestion, dental problem, ear pain, hearing loss, mouth sores, postnasal drip, rhinorrhea, sneezing, sore throat, tinnitus and trouble swallowing.   Eyes: Negative for photophobia, pain, redness and visual disturbance.  Respiratory: Negative for cough, chest tightness and shortness of breath.   Cardiovascular: Negative for chest pain, palpitations and leg swelling.  Gastrointestinal: Negative for abdominal pain, blood in stool, constipation, diarrhea, nausea and vomiting.  Endocrine: Negative for cold intolerance, heat intolerance, polydipsia, polyphagia and polyuria.  Genitourinary: Negative for dysuria, frequency, hematuria and urgency.  Musculoskeletal: Negative for arthralgias, gait problem,  myalgias and neck stiffness.  Skin: Negative for rash.  Neurological: Negative for dizziness, speech difficulty, weakness, light-headedness, numbness and headaches.  Hematological: Negative for adenopathy.  Psychiatric/Behavioral: Negative for confusion and sleep disturbance. The patient is not nervous/anxious.        Patient Active Problem List   Diagnosis Date Noted  . Hyperlipemia 07/25/2016  . Esophageal reflux 08/18/2014  . Anemia, iron deficiency 08/18/2014  . Type 2 diabetes mellitus without complication ([place]) 19/37/9024  . Menometrorrhagia   . Hypertension 01/01/2013  . OSA on CPAP   . Body mass index (bmi) 50-59.9 , adult ([place])   . COPD (chronic obstructive pulmonary disease) ([place])      Prior to Admission medications   Medication Sig Start Date End Date Taking? Authorizing Provider  losartan (COZAAR) 100 MG tablet TAKE 1 TABLET(100 MG) BY MOUTH DAILY 08/13/15  Yes Rutha Melgoza, PA-C  metFORMIN (GLUCOPHAGE-XR) 500 MG 24 hr tablet Take 1 tablet (500 mg total) by mouth daily with breakfast. 04/25/16  Yes Lamyah Creed, PA-C  omeprazole (PRILOSEC) 40 MG capsule Take 1 capsule (40 mg total) by mouth daily. 08/13/15  Yes Kadence Mimbs, PA-C  Ferrous Gluconate 325 (36 FE) MG TABS Take 1 tablet by mouth 2 (two) times daily. Take with a stool softener Patient not taking: Reported on 07/25/2016 03/23/14   Harrison Mons, PA-C  Multiple Vitamin (MULTIVITAMIN) tablet Take 1 tablet by mouth daily.    Historical Provider, MD  triamcinolone (KENALOG) 0.025 % ointment Apply 1 application topically 2 (two) times daily. Patient not taking: Reported on 07/25/2016 08/18/14   Harrison Mons, PA-C     Allergies  Allergen Reactions  . Ace Inhibitors     Tongue swelling  on Lisinopril  . Metformin And Related Nausea Only       Objective:  Physical Exam  Constitutional: She is oriented to person, place, and time. She appears well-developed and well-nourished. She is active and  cooperative. No distress.  BP 120/84   Pulse 88   Temp 98.4 F (36.9 C) (Oral)   Resp 16   Ht 5\' 6"  (1.676 m)   Wt (!) 322 lb 9.6 oz (146.3 kg)   LMP 07/11/2016   SpO2 97%   BMI 52.07 kg/m   HENT:  Head: Normocephalic and atraumatic.  Right Ear: Hearing normal.  Left Ear: Hearing normal.  Eyes: Conjunctivae are normal. No scleral icterus.  Neck: Normal range of motion. Neck supple. No thyromegaly present.  Cardiovascular: Normal rate, regular rhythm and normal heart sounds.   Pulses:      Radial pulses are 2+ on the right side, and 2+ on the left side.  Pulmonary/Chest: Effort normal and breath sounds normal.  Lymphadenopathy:       Head (right side): No tonsillar, no preauricular, no posterior auricular and no occipital adenopathy present.       Head (left side): No tonsillar, no preauricular, no posterior auricular and no occipital adenopathy present.    She has no cervical adenopathy.       Right: No supraclavicular adenopathy present.       Left: No supraclavicular adenopathy present.  Neurological: She is alert and oriented to person, place, and time. No sensory deficit.  Skin: Skin is warm, dry and intact. No rash noted. No cyanosis or erythema. Nails show no clubbing.  Psychiatric: She has a normal mood and affect. Her speech is normal and behavior is normal.    Wt Readings from Last 3 Encounters:  07/25/16 (!) 322 lb 9.6 oz (146.3 kg)  04/25/16 (!) 327 lb 6.4 oz (148.5 kg)  08/13/15 (!) 354 lb (160.6 kg)       Assessment & Plan:   Problem List Items Addressed This Visit    OSA (obstructive sleep apnea)    Stable. Weight loss will help. Encouraged continued healthy lifestyle changes.      Body mass index (bmi) 50-59.9 , adult ([place])    Encouraged her to continue the healthy lifestyle changes. Weight loss will reduce her risk in a number of areas, and will directly improve her glucose, blood pressure, lipids and OSA.      Relevant Medications   metFORMIN  (GLUCOPHAGE-XR) 500 MG 24 hr tablet   COPD (chronic obstructive pulmonary disease) (HCC)    Stable.      Hypertension    Stable/controlled. Continue ARB. Start daily ASA.      Relevant Orders   CBC with Differential/Platelet (Completed)   Anemia, iron deficiency    Due to heavy, frequent menses, which have now stabilized. No longer taking Fe supplementation. If normal today, will resolve problem from list.      Relevant Orders   CBC with Differential/Platelet (Completed)   Type 2 diabetes mellitus without complication ([place]) - Primary    Await A1C. Encouraged healthy lifestyle changes. Discussed risks associated with uncontrolled diabetes, and the benefit of statins in this setting. She is presently unwilling to take additional medications, believing that the relative risk of medications outweighs the risk of the underlying disease process.      Relevant Medications   metFORMIN (GLUCOPHAGE-XR) 500 MG 24 hr tablet   Other Relevant Orders   Hemoglobin A1c (Completed)   Comprehensive metabolic panel (Completed)  Hyperlipemia    Await lipids. Encouraged healthy lifestyle changes. Discussed risks associated with uncontrolled lipids, and the benefit of statins in this setting, even with normal lipid levels. She is presently unwilling to take additional medications, believing that the relative risk of medications outweighs the risk of the underlying disease process.       Relevant Orders   Lipid panel (Completed)    Other Visit Diagnoses    Hematuria, unspecified type       Relevant Orders   POCT urinalysis dipstick (Completed)       Return in about 3 months (around 10/24/2016) for re-evaluation of diabetes, etc..   Fara Chute, PA-C [facility] [place]

## 2016-07-25 NOTE — Progress Notes (Signed)
THIS NOTE IS USED FOR EDUCATIONAL PURPOSES ONLY!!!   Name: Tonya Moore  DOB: 01/01/1971  Age: 46 y.o. Sex: female  CC:  Chief Complaint  Patient presents with  . Follow-up    PCP: Harrison Mons, PA-C  HPI: Patient is here for f/u of T2DM, HTN, HLD.   Urinary symptoms are getting better. Denies urgency/frequency most of the time. She denies polydipsia. She reports she only feels her symptoms on her "slip up diet days."   Patient reports she is not checking her blood surgars at home.  Patient reports she is not checking her BP at home.   Patients diet/exercise includes: She reports she has joined a gym and is walking more. She's currently going to the gym 2-3 x daily. She reports she has "slip ups" but overall is doing better. She reports that she is doing more tuna and less carbs. She did not go see Vertis Kelch for nutrition counseling.   Patient has a history of IDA. She is asymptomatic. She reports that she is not taking her Fe pills. She reports that she was told she does not need to take them.   Patient reports she is tolerating her metformin well.   Patient reports her GERD is controlled. She reports that she is taking omeprazole PRN when she feels like her symptoms act up.   She has seen the optometrist and reports that her vision has actually gotten better.    ROS:  Constitutional: Negative for activity change, appetite change, fatigue and unexpected weight change.  HENT: Negative for congestion, dental problem, ear pain, hearing loss, mouth sores, postnasal drip, rhinorrhea, sneezing, sore throat, tinnitus and trouble swallowing.   Eyes: Negative for photophobia, pain, redness and visual disturbance.  Respiratory: Negative for cough, chest tightness and shortness of breath.   Cardiovascular: Negative for chest pain, palpitations and leg swelling.  Gastrointestinal: Negative for abdominal pain, blood in stool, constipation, diarrhea, nausea and vomiting.  Genitourinary:  Negative for dysuria, frequency, hematuria and urgency.  Musculoskeletal: Negative for arthralgias, gait problem, myalgias and neck stiffness.  Skin: Negative for rash.  Neurological: Negative for dizziness, speech difficulty, weakness, light-headedness, numbness and headaches.  Hematological: Negative for adenopathy.  Psychiatric/Behavioral: Negative for confusion and sleep disturbance. The patient is not nervous/anxious.    PMH:  Patient Active Problem List   Diagnosis Date Noted  . Esophageal reflux 08/18/2014  . Anemia, iron deficiency 08/18/2014  . Type 2 diabetes mellitus without complication ([place]) 93/71/6967  . Menometrorrhagia   . Hypertension 01/01/2013  . OSA on CPAP   . Body mass index (bmi) 50-59.9 , adult ([place])   . COPD (chronic obstructive pulmonary disease) (HCC)     Allergies:  Allergies  Allergen Reactions  . Ace Inhibitors     Tongue swelling on Lisinopril  . Metformin And Related Nausea Only    Medications:  Current Outpatient Prescriptions on File Prior to Visit  Medication Sig Dispense Refill  . amoxicillin (AMOXIL) 875 MG tablet Take 1 tablet (875 mg total) by mouth 2 (two) times daily. 14 tablet 0  . Ferrous Gluconate 325 (36 FE) MG TABS Take 1 tablet by mouth 2 (two) times daily. Take with a stool softener 60 tablet 5  . losartan (COZAAR) 100 MG tablet TAKE 1 TABLET(100 MG) BY MOUTH DAILY 90 tablet 3  . metFORMIN (GLUCOPHAGE-XR) 500 MG 24 hr tablet Take 1 tablet (500 mg total) by mouth daily with breakfast. 30 tablet 2  . Multiple Vitamin (MULTIVITAMIN) tablet Take  1 tablet by mouth daily.    Marland Kitchen omeprazole (PRILOSEC) 40 MG capsule Take 1 capsule (40 mg total) by mouth daily. 90 capsule 3  . triamcinolone (KENALOG) 0.025 % ointment Apply 1 application topically 2 (two) times daily. 30 g 0   No current facility-administered medications on file prior to visit.     PE:  GS: WDWN female sitting on exam table in NAD.  Vitals: BP 120/84   Pulse 88   Temp  98.4 F (36.9 C) (Oral)   Resp 16   Ht 5\' 6"  (1.676 m)   Wt (!) 322 lb 9.6 oz (146.3 kg)   LMP 07/11/2016   SpO2 97%   BMI 52.07 kg/m  HEENT: Normocephalic, atruamatic. PEARRL. No cervical lymphadenopathy. No thyroid nodules, normal size, and equal bilaterally.  Cardiovascular: RRR. No S3 or S4. No murmurs, rubs, or gallops. Pulses 2+ and equal bilateral in the upper and lower extremities. No pitting edema. No varicosities, clubbing, or cyanosis.  Pulm: CTA bilaterally. No expiratory muscle use while breathing.  GI: +BS. NTND. No rigidity or guarding. No rebound tenderness.  Neuro: CN 2-12 grossly intact.  Psych: A&O x 4. Mood and affect appropriate for situation.  Skin: Warm and dry. No rashes or excoriations on exposed skin.   A&P:  1. Type 2 diabetes mellitus without complication, without long-term current use of insulin (HCC) - Uncontrolled. Patient's symptoms are improving. Reevaluate today. Patient is very unaware of how uncontrolled her diabetes is. She reports that a lot of health is mental. She reports that her body will come "dependent" on medications if she doesn't take them. She doesn't want to take medication"unless she has to" and reports that she doesn't want [place] to order labs or put her on medications unless she needs to be. We explained to the patient that we only order labs or put patients on medications if it is medically necessary. She reports she will try to cure her diabetes without medications and by eating better and going to the gym.  Plan:  -Labs: Hemoglobin A1c, Comprehensive metabolic panel -Pharm: metFORMIN (GLUCOPHAGE-XR) 500 MG 24 hr tablet  2. Essential hypertension - controlled  Plan: CBC with Differential/Platelet  3. Iron deficiency anemia, unspecified iron deficiency anemia type - asymptomatic. Reevaluate today.  Plan: CBC with Differential/Platelet  4. Hyperlipidemia, unspecified hyperlipidemia type - Reevaluate today.  Plan: Lipid panel  5. OSA on  CPAP  6. Body mass index (bmi) 50-59.9 , adult ([place])  7. Hematuria, unspecified type - UTI in previous diagnosis.  Plan: POCT urinalysis dipstick  8. Chronic obstructive pulmonary disease, unspecified COPD type ([place])       Respectfully,  Delilah Shan, PA-S2

## 2016-07-25 NOTE — Patient Instructions (Addendum)
Depending on the lab results, we may need to increase the metformin dose, add a statin, and/or refer you to the urologist.  Doristine Devoid job on the healthy changes in your lifestyle! Keep it up!    IF you received an x-ray today, you will receive an invoice from [facility]. Please contact [facility] at [phone] with questions or concerns regarding your invoice.   IF you received labwork today, you will receive an invoice from [facility]. Please contact [facility] at [phone] with questions or concerns regarding your invoice.   Our billing staff will not be able to assist you with questions regarding bills from these companies.  You will be contacted with the lab results as soon as they are available. The fastest way to get your results is to activate your My Chart account. Instructions are located on the last page of this paperwork. If you have not heard from [place] regarding the results in 2 weeks, please contact this office.

## 2016-07-26 LAB — CBC WITH DIFFERENTIAL/PLATELET
BASOS ABS: 0 10*3/uL (ref 0.0–0.2)
Basos: 0 %
EOS (ABSOLUTE): 0.2 10*3/uL (ref 0.0–0.4)
Eos: 2 %
Hematocrit: 42.9 % (ref 34.0–46.6)
Hemoglobin: 13.7 g/dL (ref 11.1–15.9)
Immature Grans (Abs): 0 10*3/uL (ref 0.0–0.1)
Immature Granulocytes: 0 %
LYMPHS ABS: 2.7 10*3/uL (ref 0.7–3.1)
Lymphs: 30 %
MCH: 24.4 pg — ABNORMAL LOW (ref 26.6–33.0)
MCHC: 31.9 g/dL (ref 31.5–35.7)
MCV: 77 fL — ABNORMAL LOW (ref 79–97)
MONOCYTES: 5 %
MONOS ABS: 0.5 10*3/uL (ref 0.1–0.9)
NEUTROS ABS: 5.6 10*3/uL (ref 1.4–7.0)
Neutrophils: 63 %
PLATELETS: 435 10*3/uL — AB (ref 150–379)
RBC: 5.61 x10E6/uL — ABNORMAL HIGH (ref 3.77–5.28)
RDW: 15.1 % (ref 12.3–15.4)
WBC: 9 10*3/uL (ref 3.4–10.8)

## 2016-07-26 LAB — COMPREHENSIVE METABOLIC PANEL
ALT: 14 IU/L (ref 0–32)
AST: 12 IU/L (ref 0–40)
Albumin/Globulin Ratio: 1.4 (ref 1.2–2.2)
Albumin: 3.9 g/dL (ref 3.5–5.5)
Alkaline Phosphatase: 105 IU/L (ref 39–117)
BILIRUBIN TOTAL: 0.6 mg/dL (ref 0.0–1.2)
BUN/Creatinine Ratio: 16 (ref 9–23)
BUN: 12 mg/dL (ref 6–24)
CALCIUM: 9.4 mg/dL (ref 8.7–10.2)
CHLORIDE: 95 mmol/L — AB (ref 96–106)
CO2: 24 mmol/L (ref 18–29)
Creatinine, Ser: 0.73 mg/dL (ref 0.57–1.00)
GFR calc non Af Amer: 99 mL/min/{1.73_m2} (ref >59)
GFR, EST AFRICAN AMERICAN: 114 mL/min/{1.73_m2} (ref >59)
GLUCOSE: 246 mg/dL — AB (ref 65–99)
Globulin, Total: 2.8 g/dL (ref 1.5–4.5)
Potassium: 4.4 mmol/L (ref 3.5–5.2)
Sodium: 136 mmol/L (ref 134–144)
TOTAL PROTEIN: 6.7 g/dL (ref 6.0–8.5)

## 2016-07-26 LAB — LIPID PANEL
Chol/HDL Ratio: 4.5 ratio — ABNORMAL HIGH (ref 0.0–4.4)
Cholesterol, Total: 175 mg/dL (ref 100–199)
HDL: 39 mg/dL — AB (ref >39)
LDL Calculated: 106 mg/dL — ABNORMAL HIGH (ref 0–99)
Triglycerides: 149 mg/dL (ref 0–149)
VLDL Cholesterol Cal: 30 mg/dL (ref 5–40)

## 2016-07-26 LAB — HEMOGLOBIN A1C
Est. average glucose Bld gHb Est-mCnc: 318 mg/dL
HEMOGLOBIN A1C: 12.7 % — AB (ref 4.8–5.6)

## 2016-07-26 NOTE — Assessment & Plan Note (Signed)
Await lipids. Encouraged healthy lifestyle changes. Discussed risks associated with uncontrolled lipids, and the benefit of statins in this setting, even with normal lipid levels. She is presently unwilling to take additional medications, believing that the relative risk of medications outweighs the risk of the underlying disease process.

## 2016-07-26 NOTE — Assessment & Plan Note (Signed)
Stable

## 2016-07-26 NOTE — Assessment & Plan Note (Signed)
Encouraged her to continue the healthy lifestyle changes. Weight loss will reduce her risk in a number of areas, and will directly improve her glucose, blood pressure, lipids and OSA.

## 2016-07-26 NOTE — Assessment & Plan Note (Signed)
Await A1C. Encouraged healthy lifestyle changes. Discussed risks associated with uncontrolled diabetes, and the benefit of statins in this setting. She is presently unwilling to take additional medications, believing that the relative risk of medications outweighs the risk of the underlying disease process.

## 2016-07-26 NOTE — Assessment & Plan Note (Signed)
Stable. Weight loss will help. Encouraged continued healthy lifestyle changes.

## 2016-07-26 NOTE — Assessment & Plan Note (Signed)
Due to heavy, frequent menses, which have now stabilized. No longer taking Fe supplementation. If normal today, will resolve problem from list.

## 2016-07-26 NOTE — Assessment & Plan Note (Addendum)
Stable/controlled. Continue ARB. Start daily ASA.

## 2016-07-28 ENCOUNTER — Other Ambulatory Visit: Payer: Self-pay | Admitting: Physician Assistant

## 2016-07-28 DIAGNOSIS — K219 Gastro-esophageal reflux disease without esophagitis: Secondary | ICD-10-CM

## 2016-08-02 ENCOUNTER — Encounter: Payer: Self-pay | Admitting: Physician Assistant

## 2016-09-26 ENCOUNTER — Other Ambulatory Visit: Payer: Self-pay | Admitting: Physician Assistant

## 2016-09-26 DIAGNOSIS — I10 Essential (primary) hypertension: Secondary | ICD-10-CM

## 2016-10-29 ENCOUNTER — Other Ambulatory Visit: Payer: Self-pay | Admitting: Physician Assistant

## 2016-10-29 DIAGNOSIS — E119 Type 2 diabetes mellitus without complications: Secondary | ICD-10-CM

## 2016-11-07 ENCOUNTER — Ambulatory Visit: Payer: BLUE CROSS/BLUE SHIELD | Admitting: Physician Assistant

## 2016-11-21 ENCOUNTER — Ambulatory Visit: Payer: BLUE CROSS/BLUE SHIELD | Admitting: Physician Assistant

## 2016-11-21 ENCOUNTER — Encounter: Payer: Self-pay | Admitting: Physician Assistant

## 2016-11-21 VITALS — BP 124/85 | HR 88 | Temp 98.3°F | Resp 18 | Ht 66.0 in | Wt 314.0 lb

## 2016-11-21 DIAGNOSIS — E119 Type 2 diabetes mellitus without complications: Secondary | ICD-10-CM | POA: Diagnosis not present

## 2016-11-21 DIAGNOSIS — E785 Hyperlipidemia, unspecified: Secondary | ICD-10-CM | POA: Diagnosis not present

## 2016-11-21 DIAGNOSIS — D509 Iron deficiency anemia, unspecified: Secondary | ICD-10-CM | POA: Diagnosis not present

## 2016-11-21 DIAGNOSIS — G4733 Obstructive sleep apnea (adult) (pediatric): Secondary | ICD-10-CM

## 2016-11-21 DIAGNOSIS — J449 Chronic obstructive pulmonary disease, unspecified: Secondary | ICD-10-CM | POA: Diagnosis not present

## 2016-11-21 DIAGNOSIS — K219 Gastro-esophageal reflux disease without esophagitis: Secondary | ICD-10-CM

## 2016-11-21 DIAGNOSIS — Z6841 Body Mass Index (BMI) 40.0 and over, adult: Secondary | ICD-10-CM | POA: Diagnosis not present

## 2016-11-21 DIAGNOSIS — I10 Essential (primary) hypertension: Secondary | ICD-10-CM

## 2016-11-21 LAB — POCT GLYCOSYLATED HEMOGLOBIN (HGB A1C): Hemoglobin A1C: >14

## 2016-11-21 MED ORDER — LOSARTAN POTASSIUM 100 MG PO TABS: ORAL_TABLET | ORAL | 3 refills | Status: DC

## 2016-11-21 MED ORDER — METFORMIN HCL ER 500 MG PO TB24: 1000.0000 mg | ORAL_TABLET | Freq: Every day | ORAL | 3 refills | Status: DC

## 2016-11-21 MED ORDER — OMEPRAZOLE 40 MG PO CPDR: 40.0000 mg | DELAYED_RELEASE_CAPSULE | Freq: Every day | ORAL | 3 refills | Status: DC

## 2016-11-21 NOTE — Patient Instructions (Addendum)
Get creative and come up with ways to GET YOURSELF back into the gym. Be generous to yourself, but remind yourself that it is a great gift that you give yourself when you go to the gym.  INCREASE the metformin xr to 1000 mg daily.  IF you received an x-ray today, you will receive an invoice from [facility]. Please contact [facility] at [phone] with questions or concerns regarding your invoice.   IF you received labwork today, you will receive an invoice from [place]. Please contact [facility] at [phone] with questions or concerns regarding your invoice.   Our billing staff will not be able to assist you with questions regarding bills from these companies.  You will be contacted with the lab results as soon as they are available. The fastest way to get your results is to activate your My Chart account. Instructions are located on the last page of this paperwork. If you have not heard from [place] regarding the results in 2 weeks, please contact this office.

## 2016-11-21 NOTE — Progress Notes (Signed)
Patient ID: Tonya Moore, female    DOB: [birth year], 46 y.o.   MRN: [id number]  PCP: Harrison Mons, PA-C  Chief Complaint  Patient presents with  . Diabetes  . Follow-up    Subjective:   Presents for evaluation of diabetes.  This has been uncontrolled since diagnosis, as the patient has been chronically non-compliant with recommendations.  Since her visit in April, "things were going great," but then stopped going to the gym due to time constraints, feeling tired, etc x 1 month. When she was going, she was feeling really good and really enjoyed it. Had less stress. Was feeling more flexible. Lost weight.  Was going for 60 minutes, 4 times a week. Treadmill, 3%-6% incline, 2.7 mph Likes Zumba and aquatics classes. Is considering Aqua-Zumba class.  Recently has developed tingling in the toes and along the bottoms of her feet. Sensation of increased sweating.  A1C was 12.7% in April.  Review of Systems Denies chest pain, shortness of breath, HA, dizziness, vision change, nausea, vomiting, diarrhea, constipation, melena, hematochezia, dysuria, increased urinary urgency or frequency, increased hunger or thirst, unintentional weight change, unexplained myalgias or arthralgias, rash.    Patient Active Problem List   Diagnosis Date Noted  . Hyperlipemia 07/25/2016  . Esophageal reflux 08/18/2014  . Anemia, iron deficiency 08/18/2014  . Type 2 diabetes mellitus without complication ([place]) 54/98/2641  . Hypertension 01/01/2013  . OSA (obstructive sleep apnea)   . Body mass index (bmi) 50-59.9 , adult ([place])   . COPD (chronic obstructive pulmonary disease) ([place])      Prior to Admission medications   Medication Sig Start Date End Date Taking? Authorizing Provider  losartan (COZAAR) 100 MG tablet TAKE 1 TABLET(100 MG) BY MOUTH DAILY 09/27/16  Yes Daren Yeagle, PA-C  metFORMIN (GLUCOPHAGE-XR) 500 MG 24 hr tablet TAKE 1 TABLET(500 MG) BY MOUTH DAILY WITH BREAKFAST  10/31/16  Yes Shannie Kontos, PA-C  omeprazole (PRILOSEC) 40 MG capsule Take 1 capsule (40 mg total) by mouth daily. Patient taking differently: Take 40 mg by mouth daily as needed.  08/13/15  Yes Laurie Lovejoy, PA-C  omeprazole (PRILOSEC) 40 MG capsule TAKE ONE CAPSULE BY MOUTH DAILY 07/28/16  Yes Timm Bonenberger, PA-C  Ferrous Gluconate 325 (36 FE) MG TABS Take 1 tablet by mouth 2 (two) times daily. Take with a stool softener Patient not taking: Reported on 07/25/2016 03/23/14   Harrison Mons, PA-C  Multiple Vitamin (MULTIVITAMIN) tablet Take 1 tablet by mouth daily.    [provider]  triamcinolone (KENALOG) 0.025 % ointment Apply 1 application topically 2 (two) times daily. Patient not taking: Reported on 11/21/2016 08/18/14   Harrison Mons, PA-C     Allergies  Allergen Reactions  . Ace Inhibitors     Tongue swelling on Lisinopril  . Metformin And Related Nausea Only       Objective:  Physical Exam  Constitutional: She is oriented to person, place, and time. She appears well-developed and well-nourished. She is active and cooperative. No distress.  BP 124/85   Pulse 88   Temp 98.3 F (36.8 C) (Oral)   Resp 18   Ht 5\' 6"  (1.676 m)   Wt (!) 314 lb (142.4 kg)   LMP 10/31/2016   SpO2 97%   BMI 50.68 kg/m   HENT:  Head: Normocephalic and atraumatic.  Right Ear: Hearing normal.  Left Ear: Hearing normal.  Eyes: Conjunctivae are normal. No scleral icterus.  Neck: Normal range of motion. Neck supple. No  thyromegaly present.  Cardiovascular: Normal rate, regular rhythm and normal heart sounds.   Pulses:      Radial pulses are 2+ on the right side, and 2+ on the left side.  Pulmonary/Chest: Effort normal and breath sounds normal.  Lymphadenopathy:       Head (right side): No tonsillar, no preauricular, no posterior auricular and no occipital adenopathy present.       Head (left side): No tonsillar, no preauricular, no posterior auricular and no occipital adenopathy  present.    She has no cervical adenopathy.       Right: No supraclavicular adenopathy present.       Left: No supraclavicular adenopathy present.  Neurological: She is alert and oriented to person, place, and time. No sensory deficit.  Skin: Skin is warm, dry and intact. No rash noted. No cyanosis or erythema. Nails show no clubbing.  Psychiatric: She has a normal mood and affect. Her speech is normal and behavior is normal.       Wt Readings from Last 3 Encounters:  11/21/16 (!) 314 lb (142.4 kg)  07/25/16 (!) 322 lb 9.6 oz (146.3 kg)  04/25/16 (!) 327 lb 6.4 oz (148.5 kg)       Assessment & Plan:   Problem List Items Addressed This Visit    Esophageal reflux (Chronic)    Stable. Continue PPI, avoidance of triggers, and efforts for weight loss.      Relevant Medications   omeprazole (PRILOSEC) 40 MG capsule   OSA (obstructive sleep apnea)    Untreated presently, due to cost of replacement supplies. Urged her to make arrangements to resume treatment, as improving her sleep can help to reduce her weight and increase her energy, and failure to treat can lead to increased blood pressure, cardiomyopathy and early death.      Body mass index (bmi) 50-59.9 , adult (HCC)   Relevant Medications   metFORMIN (GLUCOPHAGE-XR) 500 MG 24 hr tablet   COPD (chronic obstructive pulmonary disease) (HCC)    Stable.      Hypertension    Controlled. Continue losartan 100 mg daily.      Relevant Medications   losartan (COZAAR) 100 MG tablet   Other Relevant Orders   CBC with Differential/Platelet (Completed)   Comprehensive metabolic panel (Completed)   Anemia, iron deficiency   Relevant Orders   CBC with Differential/Platelet (Completed)   Type 2 diabetes mellitus without complication ([place]) - Primary    Uncontrolled. INCREASE metformin XR to 1000 mg daily. Counseled at length on the need to get control here to reduce complications.      Relevant Medications   metFORMIN  (GLUCOPHAGE-XR) 500 MG 24 hr tablet   losartan (COZAAR) 100 MG tablet   Other Relevant Orders   POCT glycosylated hemoglobin (Hb A1C) (Completed)   Comprehensive metabolic panel (Completed)   Hyperlipemia    Await labs. Adjust regimen as indicated by results. She has previously been unwilling to take statins, as she believes the risk outweighs the benefit.      Relevant Medications   losartan (COZAAR) 100 MG tablet   Other Relevant Orders   Comprehensive metabolic panel (Completed)   Lipid panel (Completed)       Return in about 3 months (around 02/21/2017) for re-evaluation of diabetes, blood pressure, weight, cholesterol.   Fara Chute, PA-C [facility]

## 2016-11-22 LAB — CBC WITH DIFFERENTIAL/PLATELET
BASOS: 0 %
Basophils Absolute: 0 10*3/uL (ref 0.0–0.2)
EOS (ABSOLUTE): 0.2 10*3/uL (ref 0.0–0.4)
EOS: 2 %
HEMATOCRIT: 46.3 % (ref 34.0–46.6)
Hemoglobin: 14.9 g/dL (ref 11.1–15.9)
IMMATURE GRANULOCYTES: 0 %
Immature Grans (Abs): 0 10*3/uL (ref 0.0–0.1)
Lymphocytes Absolute: 3 10*3/uL (ref 0.7–3.1)
Lymphs: 26 %
MCH: 26 pg — AB (ref 26.6–33.0)
MCHC: 32.2 g/dL (ref 31.5–35.7)
MCV: 81 fL (ref 79–97)
MONOS ABS: 0.5 10*3/uL (ref 0.1–0.9)
Monocytes: 4 %
NEUTROS ABS: 7.8 10*3/uL — AB (ref 1.4–7.0)
NEUTROS PCT: 68 %
Platelets: 386 10*3/uL — ABNORMAL HIGH (ref 150–379)
RBC: 5.74 x10E6/uL — ABNORMAL HIGH (ref 3.77–5.28)
RDW: 14.8 % (ref 12.3–15.4)
WBC: 11.5 10*3/uL — ABNORMAL HIGH (ref 3.4–10.8)

## 2016-11-22 LAB — COMPREHENSIVE METABOLIC PANEL
ALT: 14 IU/L (ref 0–32)
AST: 11 IU/L (ref 0–40)
Albumin/Globulin Ratio: 1.4 (ref 1.2–2.2)
Albumin: 4 g/dL (ref 3.5–5.5)
Alkaline Phosphatase: 110 IU/L (ref 39–117)
BILIRUBIN TOTAL: 0.4 mg/dL (ref 0.0–1.2)
BUN/Creatinine Ratio: 19 (ref 9–23)
BUN: 14 mg/dL (ref 6–24)
CO2: 20 mmol/L (ref 20–29)
Calcium: 9.3 mg/dL (ref 8.7–10.2)
Chloride: 97 mmol/L (ref 96–106)
Creatinine, Ser: 0.74 mg/dL (ref 0.57–1.00)
GFR, EST AFRICAN AMERICAN: 112 mL/min/{1.73_m2} (ref >59)
GFR, EST NON AFRICAN AMERICAN: 97 mL/min/{1.73_m2} (ref >59)
GLOBULIN, TOTAL: 2.8 g/dL (ref 1.5–4.5)
Glucose: 332 mg/dL — ABNORMAL HIGH (ref 65–99)
POTASSIUM: 4.2 mmol/L (ref 3.5–5.2)
SODIUM: 133 mmol/L — AB (ref 134–144)
TOTAL PROTEIN: 6.8 g/dL (ref 6.0–8.5)

## 2016-11-22 LAB — LIPID PANEL
CHOL/HDL RATIO: 4.2 ratio (ref 0.0–4.4)
Cholesterol, Total: 161 mg/dL (ref 100–199)
HDL: 38 mg/dL — ABNORMAL LOW (ref >39)
LDL Calculated: 94 mg/dL (ref 0–99)
Triglycerides: 144 mg/dL (ref 0–149)
VLDL Cholesterol Cal: 29 mg/dL (ref 5–40)

## 2016-11-25 NOTE — Assessment & Plan Note (Signed)
Uncontrolled. INCREASE metformin XR to 1000 mg daily. Counseled at length on the need to get control here to reduce complications.

## 2016-11-25 NOTE — Assessment & Plan Note (Signed)
Untreated presently, due to cost of replacement supplies. Urged her to make arrangements to resume treatment, as improving her sleep can help to reduce her weight and increase her energy, and failure to treat can lead to increased blood pressure, cardiomyopathy and early death.

## 2016-11-25 NOTE — Assessment & Plan Note (Signed)
Await labs. Adjust regimen as indicated by results. She has previously been unwilling to take statins, as she believes the risk outweighs the benefit.

## 2016-11-25 NOTE — Assessment & Plan Note (Signed)
Controlled. ° °Continue losartan 100 mg daily. °

## 2016-11-25 NOTE — Assessment & Plan Note (Signed)
Stable

## 2016-11-25 NOTE — Assessment & Plan Note (Signed)
Stable. Continue PPI, avoidance of triggers, and efforts for weight loss.

## 2016-11-29 ENCOUNTER — Encounter: Payer: Self-pay | Admitting: Physician Assistant

## 2017-01-05 ENCOUNTER — Ambulatory Visit: Payer: BLUE CROSS/BLUE SHIELD | Admitting: Physician Assistant

## 2017-01-24 ENCOUNTER — Other Ambulatory Visit: Payer: Self-pay | Admitting: Physician Assistant

## 2017-01-24 DIAGNOSIS — I10 Essential (primary) hypertension: Secondary | ICD-10-CM

## 2017-02-03 ENCOUNTER — Telehealth: Payer: Self-pay | Admitting: Physician Assistant

## 2017-02-03 NOTE — Telephone Encounter (Signed)
Pt called in to let us know that she tried to refill her high blood pressure med and it will not go through.   Please advise pt. - Best callback # [phone]  Pharmacy - [facility] at [address]

## 2017-02-03 NOTE — Telephone Encounter (Signed)
She should have refills on file at the pharmacy. Please contact the pharmacy and ask about the prescription that I wrote on 8/21 for a 90-day supply with refills for 1 year.

## 2017-02-06 ENCOUNTER — Telehealth: Payer: Self-pay

## 2017-02-06 NOTE — Telephone Encounter (Signed)
Spoke with pharmacy and Pt and lorsartan will be refilled.

## 2017-02-23 ENCOUNTER — Ambulatory Visit: Payer: BLUE CROSS/BLUE SHIELD | Admitting: Physician Assistant

## 2017-03-21 NOTE — Telephone Encounter (Signed)
Error-Sign Encounter 

## 2017-06-08 DIAGNOSIS — L281 Prurigo nodularis: Secondary | ICD-10-CM | POA: Diagnosis not present

## 2017-07-05 ENCOUNTER — Encounter: Payer: Self-pay | Admitting: Physician Assistant

## 2017-09-25 ENCOUNTER — Ambulatory Visit: Payer: BLUE CROSS/BLUE SHIELD | Admitting: Physician Assistant

## 2017-09-25 ENCOUNTER — Ambulatory Visit: Payer: Self-pay

## 2017-09-25 ENCOUNTER — Encounter: Payer: Self-pay | Admitting: Physician Assistant

## 2017-09-25 VITALS — BP 155/104 | HR 95 | Temp 98.6°F | Resp 17 | Ht 66.0 in | Wt 312.0 lb

## 2017-09-25 DIAGNOSIS — E119 Type 2 diabetes mellitus without complications: Secondary | ICD-10-CM

## 2017-09-25 DIAGNOSIS — J449 Chronic obstructive pulmonary disease, unspecified: Secondary | ICD-10-CM

## 2017-09-25 DIAGNOSIS — J22 Unspecified acute lower respiratory infection: Secondary | ICD-10-CM

## 2017-09-25 LAB — POCT GLYCOSYLATED HEMOGLOBIN (HGB A1C)
HbA1c POC (<> result, manual entry): >14 % (ref 4.0–5.6)
Hemoglobin A1C: 14 % — AB (ref 4.0–5.6)

## 2017-09-25 MED ORDER — DAPAGLIFLOZIN PROPANEDIOL 10 MG PO TABS: 10.0000 mg | ORAL_TABLET | Freq: Every day | ORAL | 6 refills | Status: DC

## 2017-09-25 MED ORDER — ALBUTEROL SULFATE HFA 108 (90 BASE) MCG/ACT IN AERS
2.0000 | INHALATION_SPRAY | RESPIRATORY_TRACT | 1 refills | Status: DC | PRN
Start: 2017-09-25 — End: 2017-12-19

## 2017-09-25 MED ORDER — AZITHROMYCIN 250 MG PO TABS: ORAL_TABLET | ORAL | 0 refills | Status: DC

## 2017-09-25 NOTE — Telephone Encounter (Signed)
Pt. Reports 2 weeks ago she burned a pot on the stove and the fumes were "bad."Since then has noticed increased shortness of breath. Has wheezing and rattling in chest. Occasionally coughs up brown phlegm in the morning. Has tightness between her shoulder blades. States she is very concerned, especially with her COPD diagnosis. Appointment made for tomorrow. Reason for Disposition . [1] MODERATE longstanding difficulty breathing (e.g., speaks in phrases, SOB even at rest, pulse 100-120) AND [2] SAME as normal  Answer Assessment - Initial Assessment Questions 1. RESPIRATORY STATUS: "Describe your breathing?" (e.g., wheezing, shortness of breath, unable to speak, severe coughing)      Shortness of breath, wheezing and some rattling 2. ONSET: "When did this breathing problem begin?"      2 WEEKS AGO 3. PATTERN "Does the difficult breathing come and go, or has it been constant since it started?"      cONSTANT 4. SEVERITY: "How bad is your breathing?" (e.g., mild, moderate, severe)    - MILD: No SOB at rest, mild SOB with walking, speaks normally in sentences, can lay down, no retractions, pulse < 100.    - MODERATE: SOB at rest, SOB with minimal exertion and prefers to sit, cannot lie down flat, speaks in phrases, mild retractions, audible wheezing, pulse 100-120.    - SEVERE: Very SOB at rest, speaks in single words, struggling to breathe, sitting hunched forward, retractions, pulse > 120      mILD 5. RECURRENT SYMPTOM: "Have you had difficulty breathing before?" If so, ask: "When was the last time?" and "What happened that time?"      Yes 6. CARDIAC HISTORY: "Do you have any history of heart disease?" (e.g., heart attack, angina, bypass surgery, angioplasty)      No 7. LUNG HISTORY: "Do you have any history of lung disease?"  (e.g., pulmonary embolus, asthma, emphysema)     COPD 8. CAUSE: "What do you think is causing the breathing problem?"      Burned a pot and the smell irritated her 9.  OTHER SYMPTOMS: "Do you have any other symptoms? (e.g., dizziness, runny nose, cough, chest pain, fever)     Tired 10. PREGNANCY: "Is there any chance you are pregnant?" "When was your last menstrual period?"       No 11. TRAVEL: "Have you traveled out of the country in the last month?" (e.g., travel history, exposures)       No  Protocols used: BREATHING DIFFICULTY-A-AH

## 2017-09-25 NOTE — Patient Instructions (Addendum)
Diabetes.org is an excellent resource for helping lower your blood sugars.  Please work hard on getting regular movement in on a weekly basis and eating a diabetic-friendly diet.   Metformin 500 mg; start taking 2 pills twice daily.  Start taking dapagliflozin (FARXIGA) 10 MG tablet; Take 10 mg by mouth daily. We may need to get a "prior authorization" for this medication.   Your A1C today is 14% - that is a daily blood sugar of 355. This is way too high to maintain normal function of the important organs in your body.   Come back and see me in 3 months to recheck.   Results for orders placed or performed in visit on 09/25/17  POCT glycosylated hemoglobin (Hb A1C)  Result Value Ref Range   Hemoglobin A1C 14.0 (A) 4.0 - 5.6 %   HbA1c POC (<> result, manual entry) >14.0 4.0 - 5.6 %   HbA1c, POC (prediabetic range)  5.7 - 6.4 %   HbA1c, POC (controlled diabetic range)  0.0 - 7.0 %    Diabetes Mellitus and Exercise Exercising regularly is important for your overall health, especially when you have diabetes (diabetes mellitus). Exercising is not only about losing weight. It has many health benefits, such as increasing muscle strength and bone density and reducing body fat and stress. This leads to improved fitness, flexibility, and endurance, all of which result in better overall health. Exercise has additional benefits for people with diabetes, including:  Reducing appetite.  Helping to lower and control blood glucose.  Lowering blood pressure.  Helping to control amounts of fatty substances (lipids) in the blood, such as cholesterol and triglycerides.  Helping the body to respond better to insulin (improving insulin sensitivity).  Reducing how much insulin the body needs.  Decreasing the risk for heart disease by: ? Lowering cholesterol and triglyceride levels. ? Increasing the levels of good cholesterol. ? Lowering blood glucose levels.  What is my activity plan? Your health  care provider or certified diabetes educator can help you make a plan for the type and frequency of exercise (activity plan) that works for you. Make sure that you:  Do at least 150 minutes of moderate-intensity or vigorous-intensity exercise each week. This could be brisk walking, biking, or water aerobics. ? Do stretching and strength exercises, such as yoga or weightlifting, at least 2 times a week. ? Spread out your activity over at least 3 days of the week.  Get some form of physical activity every day. ? Do not go more than 2 days in a row without some kind of physical activity. ? Avoid being inactive for more than 90 minutes at a time. Take frequent breaks to walk or stretch.  Choose a type of exercise or activity that you enjoy, and set realistic goals.  Start slowly, and gradually increase the intensity of your exercise over time.  What do I need to know about managing my diabetes?  Check your blood glucose before and after exercising. ? If your blood glucose is higher than 240 mg/dL (13.3 mmol/L) before you exercise, check your urine for ketones. If you have ketones in your urine, do not exercise until your blood glucose returns to normal.  Know the symptoms of low blood glucose (hypoglycemia) and how to treat it. Your risk for hypoglycemia increases during and after exercise. Common symptoms of hypoglycemia can include: ? Hunger. ? Anxiety. ? Sweating and feeling clammy. ? Confusion. ? Dizziness or feeling light-headed. ? Increased heart rate or palpitations. ?  Blurry vision. ? Tingling or numbness around the mouth, lips, or tongue. ? Tremors or shakes. ? Irritability.  Keep a rapid-acting carbohydrate snack available before, during, and after exercise to help prevent or treat hypoglycemia.  Avoid injecting insulin into areas of the body that are going to be exercised. For example, avoid injecting insulin into: ? The arms, when playing tennis. ? The legs, when  jogging.  Keep records of your exercise habits. Doing this can help you and your health care provider adjust your diabetes management plan as needed. Write down: ? Food that you eat before and after you exercise. ? Blood glucose levels before and after you exercise. ? The type and amount of exercise you have done. ? When your insulin is expected to peak, if you use insulin. Avoid exercising at times when your insulin is peaking.  When you start a new exercise or activity, work with your health care provider to make sure the activity is safe for you, and to adjust your insulin, medicines, or food intake as needed.  Drink plenty of water while you exercise to prevent dehydration or heat stroke. Drink enough fluid to keep your urine clear or pale yellow. This information is not intended to replace advice given to you by your health care provider. Make sure you discuss any questions you have with your health care provider. Document Released: 06/10/2003 Document Revised: 10/08/2015 Document Reviewed: 08/30/2015 Elsevier Interactive Patient Education  [employer].  Thank you for coming in today. I hope you feel we met your needs.  Feel free to call PCP if you have any questions or further requests.  Please consider signing up for MyChart if you do not already have it, as this is a great way to communicate with me.  Best,  Whitney McVey, PA-C  IF you received an x-ray today, you will receive an invoice from [facility]. Please contact [facility] at [phone] with questions or concerns regarding your invoice.   IF you received labwork today, you will receive an invoice from [facility]. Please contact [facility] at [phone] with questions or concerns regarding your invoice.   Our billing staff will not be able to assist you with questions regarding bills from these companies.  You will be contacted with the lab results as soon as they are available. The fastest way to  get your results is to activate your My Chart account. Instructions are located on the last page of this paperwork. If you have not heard from [place] regarding the results in 2 weeks, please contact this office.

## 2017-09-25 NOTE — Progress Notes (Signed)
Tonya Moore  MRN: [id number] DOB: Mar 21, 1971  PCP: Dorise Hiss, PA-C  Subjective:  Pt is a 47 year old female who presents to clinic for shortness of breath and cough. Denies fever, chills, n/v, diaphoresis, chest pain  Invokana - too expensive. She is not checking home sugars. Last DM check > 6 months ago. She does not have PCP.   Pt  has a past medical history of Anemia, COPD (chronic obstructive pulmonary disease) ([facility]) (2005), GERD (gastroesophageal reflux disease), Hypertension, Menometrorrhagia, Obesity, and OSA on CPAP.  Review of Systems  Respiratory: Positive for cough and shortness of breath. Negative for chest tightness and wheezing.   Cardiovascular: Negative for chest pain and palpitations.    Patient Active Problem List   Diagnosis Date Noted  . Hyperlipemia 07/25/2016  . Esophageal reflux 08/18/2014  . Anemia, iron deficiency 08/18/2014  . Type 2 diabetes mellitus without complication ([place]) 41/28/7867  . Hypertension 01/01/2013  . OSA (obstructive sleep apnea)   . Body mass index (bmi) 50-59.9 , adult   . COPD (chronic obstructive pulmonary disease) ([facility])     Current Outpatient Medications on File Prior to Visit  Medication Sig Dispense Refill  . losartan (COZAAR) 100 MG tablet TAKE 1 TABLET(100 MG) BY MOUTH DAILY 90 tablet 3  . metFORMIN (GLUCOPHAGE-XR) 500 MG 24 hr tablet Take 2 tablets (1,000 mg total) by mouth daily with breakfast. 180 tablet 3  . Multiple Vitamin (MULTIVITAMIN) tablet Take 1 tablet by mouth daily.    Marland Kitchen omeprazole (PRILOSEC) 40 MG capsule Take 1 capsule (40 mg total) by mouth daily. 90 capsule 3  . triamcinolone (KENALOG) 0.025 % ointment Apply 1 application topically 2 (two) times daily. 30 g 0  . Ferrous Gluconate 325 (36 FE) MG TABS Take 1 tablet by mouth 2 (two) times daily. Take with a stool softener (Patient not taking: Reported on 07/25/2016) 60 tablet 5   No current facility-administered medications on file prior to  visit.     Allergies  Allergen Reactions  . Ace Inhibitors     Tongue swelling on Lisinopril  . Metformin And Related Nausea Only     Objective:  BP (!) 155/104   Pulse 95   Temp 98.6 F (37 C) (Oral)   Resp 17   Ht 5\' 6"  (1.676 m)   Wt (!) 312 lb (141.5 kg)   LMP 09/13/2017   SpO2 98%   BMI 50.36 kg/m   Physical Exam  Constitutional: She is oriented to person, place, and time. No distress.  Cardiovascular: Normal rate, regular rhythm and normal heart sounds.  Pulmonary/Chest: Breath sounds normal. She has no wheezes. She has no rhonchi. She has no rales.  Neurological: She is alert and oriented to person, place, and time.  Skin: Skin is warm and dry.  Psychiatric: Judgment normal.  Vitals reviewed.   Lab Results  Component Value Date   HGBA1C 14.0 (A) 09/25/2017   HGBA1C >14.0 09/25/2017   Assessment and Plan :  1. Chronic obstructive pulmonary disease, unspecified COPD type ([place]) 2. Lower respiratory infection - pt presents c/o shob and cough, h/o COPD. vitals are stable. Plan to cover with azithromycin and albuterol PRN.  3. Type 2 diabetes mellitus without complication, without long-term current use of insulin ([place]) - Uncontrolled DM with A1C today of 14. She is not taking medications. Plan to restart Metformin and Farxiga. Discussed importance of regular exercise and DM-friendly diet.. RTC in 3 months for recheck.  - POCT glycosylated  hemoglobin (Hb A1C) - dapagliflozin propanediol (FARXIGA) 10 MG TABS tablet; Take 10 mg by mouth daily. (Patient not taking: Reported on 12/19/2017)  Dispense: 30 tablet; Refill: 6   Tonya Vernel Donlan, PA-C  [facility] 09/25/2017 5:19 PM

## 2017-10-15 ENCOUNTER — Telehealth: Payer: Self-pay | Admitting: *Deleted

## 2017-10-15 NOTE — Telephone Encounter (Signed)
PA STARTED FARXIGA  KEY :[id number]

## 2017-10-18 ENCOUNTER — Telehealth: Payer: Self-pay | Admitting: Physician Assistant

## 2017-10-18 NOTE — Telephone Encounter (Signed)
Pt was denied Farxiga 10 mg tablets. Denial letter in providers box.

## 2017-11-28 ENCOUNTER — Other Ambulatory Visit: Payer: Self-pay | Admitting: Physician Assistant

## 2017-11-28 DIAGNOSIS — E119 Type 2 diabetes mellitus without complications: Secondary | ICD-10-CM

## 2017-11-28 MED ORDER — EMPAGLIFLOZIN 10 MG PO TABS: 10.0000 mg | ORAL_TABLET | Freq: Every day | ORAL | 3 refills | Status: DC

## 2017-11-29 ENCOUNTER — Telehealth: Payer: Self-pay

## 2017-11-29 NOTE — Telephone Encounter (Signed)
-----   Message from Dorise Hiss, PA-C sent at 11/28/2017 11:00 AM EDT ----- Regarding: DM medicaiton  Please call pt and let her know PA for the diabetes medication Wilder Glade was denied. I sent Rx for a different medication: Jardiance to her pharmacy. Start taking 10mg  daily. Come back and see me in 2 months for recheck.  Thank you!

## 2017-11-29 NOTE — Telephone Encounter (Signed)
Called pt and left McVey's message. Re: new med-Jardiance 1 tab 10mg  daily.  Advised her to go ahead and call the office to get her 2 month recheck appointment on the books.

## 2017-12-01 ENCOUNTER — Telehealth: Payer: Self-pay | Admitting: Physician Assistant

## 2017-12-01 NOTE — Telephone Encounter (Signed)
Pt. Called requesting Jardiance be rerouted to [facility] at [address].

## 2017-12-05 ENCOUNTER — Telehealth: Payer: Self-pay | Admitting: Physician Assistant

## 2017-12-05 NOTE — Telephone Encounter (Signed)
Please advise 

## 2017-12-05 NOTE — Telephone Encounter (Signed)
Copied from [place] [id number]. Topic: Quick Communication - Rx Refill/Question >> Dec 05, 2017  9:10 AM Alfredia Ferguson R wrote: Medication: metFORMIN (GLUCOPHAGE-XR) 500 MG 24 hr tablet                     losartan (COZAAR) 100 MG tablet  Has the patient contacted their pharmacy? Yes  Preferred Pharmacy (with phone number or street name): [id number] - [place], [place] - [address] AT [address] [phone] (Phone) [phone] (Fax)

## 2017-12-05 NOTE — Telephone Encounter (Signed)
Copied from [place] [id number]. Topic: General - Other >> Dec 05, 2017  9:12 AM Tonya Moore R wrote: Pt called in and states she needs a prescription faxed to [facility] for a CPap machine

## 2017-12-06 ENCOUNTER — Other Ambulatory Visit: Payer: Self-pay | Admitting: Physician Assistant

## 2017-12-06 ENCOUNTER — Other Ambulatory Visit: Payer: Self-pay

## 2017-12-06 DIAGNOSIS — E119 Type 2 diabetes mellitus without complications: Secondary | ICD-10-CM

## 2017-12-06 MED ORDER — EMPAGLIFLOZIN 10 MG PO TABS: 10.0000 mg | ORAL_TABLET | Freq: Every day | ORAL | 3 refills | Status: DC

## 2017-12-06 MED ORDER — METFORMIN HCL ER 500 MG PO TB24: 1000.0000 mg | ORAL_TABLET | Freq: Every day | ORAL | 0 refills | Status: DC

## 2017-12-06 NOTE — Telephone Encounter (Signed)
I don't know what kind of CPAP she should use, nor the settings. I am more than happy to send her for sleep study.  Prior Auth - Are CPAPs covered by insurance if I order it?

## 2017-12-06 NOTE — Telephone Encounter (Signed)
Please see note below and advise  

## 2017-12-06 NOTE — Telephone Encounter (Signed)
Rx sent in

## 2017-12-07 NOTE — Telephone Encounter (Signed)
Pt states that [facility] on [address] states they have not received the Metformin or Jardiance. Pt asking if this can be called back in.

## 2017-12-10 ENCOUNTER — Telehealth: Payer: Self-pay | Admitting: Physician Assistant

## 2017-12-10 NOTE — Telephone Encounter (Signed)
Left message.  Patient would need a sleep study for a cpap machine.  Has she had one? We can put a referral in for one if she would like.

## 2017-12-10 NOTE — Telephone Encounter (Signed)
Copied from [place] [phone]. Topic: Quick Communication - Rx Refill/Question >> Dec 10, 2017  2:20 PM Synthia Innocent wrote: Medication: losartan (COZAAR) 100 MG tablet   Has the patient contacted their pharmacy? Yes.   (Agent: If no, request that the patient contact the pharmacy for the refill.) (Agent: If yes, when and what did the pharmacy advise?)  Preferred Pharmacy (with phone number or street name): [facility] on [address]  Agent: Please be advised that RX refills may take up to 3 business days. We ask that you follow-up with your pharmacy.

## 2017-12-11 ENCOUNTER — Other Ambulatory Visit: Payer: Self-pay | Admitting: *Deleted

## 2017-12-11 DIAGNOSIS — I10 Essential (primary) hypertension: Secondary | ICD-10-CM

## 2017-12-11 MED ORDER — LOSARTAN POTASSIUM 100 MG PO TABS: ORAL_TABLET | ORAL | 0 refills | Status: DC

## 2017-12-13 ENCOUNTER — Ambulatory Visit: Payer: Self-pay | Admitting: Physician Assistant

## 2017-12-13 NOTE — Telephone Encounter (Signed)
Patient is calling and stated that she started empagliflozin (JARDIANCE) 10 MG TABS tablet on 9/5 and she is having skin irration around both elbows (itching and burning)  Call to patient- she is wondering if the Jardiance is causing the rash she is having on her elbows. Appointment given to evaluate elbows Reason for Disposition . [1] Looks infected (spreading redness, pus) AND [2] diabetes mellitus or weak immune system (e.g., HIV positive, cancer chemo, splenectomy, organ transplant, chronic steroids)  Answer Assessment - Initial Assessment Questions 1. APPEARANCE of RASH: "Describe the rash."      On both elbows- bumpy rash- even blistery  2. LOCATION: "Where is the rash located?"      Circular around the elbows 3. NUMBER: "How many spots are there?"      1 big ring around both elbows 4. SIZE: "How big are the spots?" (Inches, centimeters or compare to size of a coin)      Encompasses whole elbow 5. ONSET: "When did the rash start?"      Started a few days ago- irritated now 6. ITCHING: "Does the rash itch?" If so, ask: "How bad is the itch?"  (Scale 1-10; or mild, moderate, severe)     Yes- stings- 4-5 7. PAIN: "Does the rash hurt?" If so, ask: "How bad is the pain?"  (Scale 1-10; or mild, moderate, severe)     Yes- when touches- moderate 8. OTHER SYMPTOMS: "Do you have any other symptoms?" (e.g., fever)     no 9. PREGNANCY: "Is there any chance you are pregnant?" "When was your last menstrual period?"     No- LMP- end of August  Protocols used: [place]

## 2017-12-14 ENCOUNTER — Ambulatory Visit: Payer: Self-pay | Admitting: Physician Assistant

## 2017-12-19 ENCOUNTER — Other Ambulatory Visit: Payer: Self-pay

## 2017-12-19 ENCOUNTER — Ambulatory Visit: Payer: BLUE CROSS/BLUE SHIELD | Admitting: Physician Assistant

## 2017-12-19 VITALS — BP 153/98 | HR 97 | Temp 98.0°F | Resp 16 | Ht 66.0 in | Wt 294.0 lb

## 2017-12-19 DIAGNOSIS — R21 Rash and other nonspecific skin eruption: Secondary | ICD-10-CM | POA: Diagnosis not present

## 2017-12-19 DIAGNOSIS — Z23 Encounter for immunization: Secondary | ICD-10-CM | POA: Diagnosis not present

## 2017-12-19 MED ORDER — MUPIROCIN 2 % EX OINT: 1.0000 "application " | TOPICAL_OINTMENT | Freq: Three times a day (TID) | CUTANEOUS | 1 refills | Status: DC

## 2017-12-19 NOTE — Progress Notes (Signed)
DER GAGLIANO  MRN: [id number] DOB: 1970-12-28  PCP: Dorise Hiss, PA-C  Subjective:  Pt is a 47 year old female who presents to clinic for rash on elbows x 2 weeks.   Review of Systems  Gastrointestinal: Negative for abdominal pain, nausea and vomiting.  Skin: Positive for rash.    Patient Active Problem List   Diagnosis Date Noted  . Hyperlipemia 07/25/2016  . Esophageal reflux 08/18/2014  . Anemia, iron deficiency 08/18/2014  . Type 2 diabetes mellitus without complication ([place]) 66/44/0347  . Hypertension 01/01/2013  . OSA (obstructive sleep apnea)   . Body mass index (bmi) 50-59.9 , adult   . COPD (chronic obstructive pulmonary disease) ([place])     Current Outpatient Medications on File Prior to Visit  Medication Sig Dispense Refill  . Ferrous Gluconate 325 (36 FE) MG TABS Take 1 tablet by mouth 2 (two) times daily. Take with a stool softener 60 tablet 5  . losartan (COZAAR) 100 MG tablet TAKE 1 TABLET(100 MG) BY MOUTH DAILY 30 tablet 0  . metFORMIN (GLUCOPHAGE-XR) 500 MG 24 hr tablet Take 2 tablets (1,000 mg total) by mouth daily with breakfast. 180 tablet 0  . Multiple Vitamin (MULTIVITAMIN) tablet Take 1 tablet by mouth daily.    Marland Kitchen omeprazole (PRILOSEC) 40 MG capsule Take 1 capsule (40 mg total) by mouth daily. 90 capsule 3  . triamcinolone (KENALOG) 0.025 % ointment Apply 1 application topically 2 (two) times daily. 30 g 0  . azithromycin (ZITHROMAX) 250 MG tablet Take 2 tabs PO x 1 dose, then 1 tab PO QD x 4 days (Patient not taking: Reported on 12/19/2017) 6 tablet 0  . dapagliflozin propanediol (FARXIGA) 10 MG TABS tablet Take 10 mg by mouth daily. (Patient not taking: Reported on 12/19/2017) 30 tablet 6  . empagliflozin (JARDIANCE) 10 MG TABS tablet Take 10 mg by mouth daily. (Patient not taking: Reported on 12/19/2017) 30 tablet 3   No current facility-administered medications on file prior to visit.     Allergies  Allergen Reactions  . Ace  Inhibitors     Tongue swelling on Lisinopril  . Metformin And Related Nausea Only     Objective:  BP (!) 153/98   Pulse 97   Temp 98 F (36.7 C) (Oral)   Resp 16   Ht 5\' 6"  (1.676 m)   Wt 294 lb (133.4 kg)   SpO2 96%   BMI 47.45 kg/m   Physical Exam  Constitutional: She appears well-developed and well-nourished.  Skin: Skin is warm and dry.     Crusted lesions on erythematous base scattered about b/l elbows. A few vesicles unroofed and wound culture taken.  Psychiatric: She has a normal mood and affect. Her behavior is normal. Judgment and thought content normal.  Vitals reviewed.       Assessment and Plan :  1. Rash and nonspecific skin eruption - Suspect bacterial infection b/l elbows. Wound culture is pending. Will treat topically with bactroban. - mupirocin ointment (BACTROBAN) 2 %; Apply 1 application topically 3 (three) times daily.  Dispense: 30 g; Refill: 1 - WOUND CULTURE 2. Need for influenza vaccination - Administered by CMA.  - Flu Vaccine QUAD 36+ mos IM   Mercer Pod, PA-C  [facility] at [place] 12/19/2017 3:14 PM  Please note: Portions of this report may have been transcribed using dragon voice recognition software. Every effort was made to ensure accuracy; however, inadvertent computerized transcription errors may be present.

## 2017-12-19 NOTE — Patient Instructions (Addendum)
    Apply Bactroban to affected area 3 times daily for 10 days  Keep up the great work!!!!!! See below for more tips/ideas.  Come back and see me in one month to recheck blood sugar.    - Consider the app Sworkit. Fitness trackers such as smart-watches, smart-phones or Fitbits can help as well.   NUTRITION Consider these websites for more information [employer] (https://www.henry-hernandez.biz/) [employer] (WindowBlog.ch)    IF you received an x-ray today, you will receive an invoice from [facility]. Please contact [facility] at [phone] with questions or concerns regarding your invoice.   IF you received labwork today, you will receive an invoice from [facility]. Please contact [facility] at [phone] with questions or concerns regarding your invoice.   Our billing staff will not be able to assist you with questions regarding bills from these companies.  You will be contacted with the lab results as soon as they are available. The fastest way to get your results is to activate your My Chart account. Instructions are located on the last page of this paperwork. If you have not heard from [place] regarding the results in 2 weeks, please contact this office.

## 2017-12-21 LAB — WOUND CULTURE: Organism ID, Bacteria: NONE SEEN

## 2017-12-24 ENCOUNTER — Encounter: Payer: Self-pay | Admitting: Physician Assistant

## 2017-12-25 ENCOUNTER — Ambulatory Visit: Payer: BLUE CROSS/BLUE SHIELD | Admitting: Physician Assistant

## 2017-12-26 ENCOUNTER — Encounter: Payer: Self-pay | Admitting: Physician Assistant

## 2017-12-26 ENCOUNTER — Encounter: Payer: Self-pay | Admitting: *Deleted

## 2017-12-28 NOTE — Telephone Encounter (Signed)
Copied from [place] [id number]. Topic: Quick Communication - See Telephone Encounter >> Dec 28, 2017 10:19 AM Antonieta Iba C wrote: CRM for notification. See Telephone encounter for: 12/28/17.  Tonya Moore w/ [facility] called in stating that he received a request for another C-Pap machine. He said that pt is needing an ov (face to face) and also a Rx stating that pt is using C-Pap machine and how it is benefiting her. Pt currently has OSA.  Please fax Rx to: [phone]  CB: [phone] ext [phone]

## 2018-01-03 ENCOUNTER — Encounter: Payer: Self-pay | Admitting: Physician Assistant

## 2018-01-03 ENCOUNTER — Ambulatory Visit: Payer: BLUE CROSS/BLUE SHIELD | Admitting: Physician Assistant

## 2018-01-03 VITALS — BP 130/90 | HR 95 | Temp 98.4°F | Resp 16 | Ht 63.5 in | Wt 295.6 lb

## 2018-01-03 DIAGNOSIS — Z6841 Body Mass Index (BMI) 40.0 and over, adult: Secondary | ICD-10-CM | POA: Diagnosis not present

## 2018-01-03 DIAGNOSIS — G4733 Obstructive sleep apnea (adult) (pediatric): Secondary | ICD-10-CM

## 2018-01-03 DIAGNOSIS — E119 Type 2 diabetes mellitus without complications: Secondary | ICD-10-CM | POA: Diagnosis not present

## 2018-01-03 LAB — POCT GLYCOSYLATED HEMOGLOBIN (HGB A1C): Hemoglobin A1C: 11.6 % — AB (ref 4.0–5.6)

## 2018-01-03 MED ORDER — EMPAGLIFLOZIN 10 MG PO TABS: 10.0000 mg | ORAL_TABLET | Freq: Every day | ORAL | 3 refills | Status: DC

## 2018-01-03 MED ORDER — METFORMIN HCL ER 500 MG PO TB24: 1000.0000 mg | ORAL_TABLET | Freq: Two times a day (BID) | ORAL | 3 refills | Status: DC

## 2018-01-03 NOTE — Progress Notes (Signed)
Tonya Moore  MRN: [id number] DOB: 09-19-1970  PCP: Dorise Hiss, PA-C  Subjective:  Pt is a 47 year old female presents to clinic for f/u DM.   Metformin 1,000 mg once daily and Jardiance 10mg  daily.  She exercises regularly, at least 3x/week. Plans to get thera-bands, medicine ball and a mat to have in her home.  Last A!C about 3 months ago, 14.0 Does not check home sugars.  Tries to lay off bread, veggies, oatmeal, sweet potatoes.  She admits to "cheating" with [employer] deb[employer] snack cakes a few times/week She has lost about 60 lbs in 2 years  Wt Readings from Last 3 Encounters:  01/03/18 295 lb 9.6 oz (134.1 kg)  12/19/17 294 lb (133.4 kg)  09/25/17 (!) 312 lb (141.5 kg)   H/o severe OSA - CPAP died in late Dec 23, 2022. She has not been using it since then. She can tell a mild difference. She has to sleep on 2-3 pillows. Her throat is sore a lot of mornings.  Denies daytime extreme fatigue.  Denies HA, elevated blood pressures.   Review of Systems  Constitutional: Negative for chills, diaphoresis, fatigue and fever.  Respiratory: Negative for cough, chest tightness, shortness of breath and wheezing.   Cardiovascular: Negative for chest pain and palpitations.  Endocrine: Negative for polydipsia, polyphagia and polyuria.  Neurological: Negative for weakness, light-headedness and headaches.    Patient Active Problem List   Diagnosis Date Noted  . Hyperlipemia 07/25/2016  . Esophageal reflux 08/18/2014  . Anemia, iron deficiency 08/18/2014  . Type 2 diabetes mellitus without complication ([place]) 65/68/1275  . Hypertension 01/01/2013  . OSA (obstructive sleep apnea)   . Body mass index (bmi) 50-59.9 , adult   . COPD (chronic obstructive pulmonary disease) (HCC)     Current Outpatient Medications on File Prior to Visit  Medication Sig Dispense Refill  . empagliflozin (JARDIANCE) 10 MG TABS tablet Take 10 mg by mouth daily. 30 tablet 3  . losartan (COZAAR) 100 MG  tablet TAKE 1 TABLET(100 MG) BY MOUTH DAILY 30 tablet 0  . metFORMIN (GLUCOPHAGE-XR) 500 MG 24 hr tablet Take 2 tablets (1,000 mg total) by mouth daily with breakfast. 180 tablet 0  . mupirocin ointment (BACTROBAN) 2 % Apply 1 application topically 3 (three) times daily. 30 g 1  . omeprazole (PRILOSEC) 40 MG capsule Take 1 capsule (40 mg total) by mouth daily. 90 capsule 3  . triamcinolone (KENALOG) 0.025 % ointment Apply 1 application topically 2 (two) times daily. 30 g 0  . dapagliflozin propanediol (FARXIGA) 10 MG TABS tablet Take 10 mg by mouth daily. (Patient not taking: Reported on 12/19/2017) 30 tablet 6  . Ferrous Gluconate 325 (36 FE) MG TABS Take 1 tablet by mouth 2 (two) times daily. Take with a stool softener (Patient not taking: Reported on 01/03/2018) 60 tablet 5  . Multiple Vitamin (MULTIVITAMIN) tablet Take 1 tablet by mouth daily.     No current facility-administered medications on file prior to visit.     Allergies  Allergen Reactions  . Ace Inhibitors     Tongue swelling on Lisinopril  . Metformin And Related Nausea Only     Objective:  BP 130/90 (BP Location: Left Arm, Patient Position: Sitting, Cuff Size: Large)   Pulse 95   Temp 98.4 F (36.9 C) (Oral)   Resp 16   Ht 5' 3.5" (1.613 m)   Wt 295 lb 9.6 oz (134.1 kg)   SpO2 95%   BMI 51.54  kg/m   Physical Exam  Constitutional: She is oriented to person, place, and time. She appears well-developed and well-nourished. No distress.  obese  Cardiovascular: Normal rate, regular rhythm and normal heart sounds.  Neurological: She is alert and oriented to person, place, and time.  Skin: Skin is warm and dry.  Psychiatric: Judgment normal.  Vitals reviewed.  Results for orders placed or performed in visit on 01/03/18  POCT glycosylated hemoglobin (Hb A1C)  Result Value Ref Range   Hemoglobin A1C 11.6 (A) 4.0 - 5.6 %   HbA1c POC (<> result, manual entry)     HbA1c, POC (prediabetic range)     HbA1c, POC  (controlled diabetic range)      Assessment and Plan :  1. Type 2 diabetes mellitus without complication, without long-term current use of insulin (HCC) - Pt is making great changes to lifestyle and weight. A1C still elevated at 11.6, which is down from 14 about 3 months ago. Con't Jardiance 10mg , increase Metformin XR to 1,000mg  bid. Encouraged con't lifestyle changes. RTC in 3 months for recheck.  - POCT glycosylated hemoglobin (Hb A1C) - metFORMIN (GLUCOPHAGE XR) 500 MG 24 hr tablet; Take 2 tablets (1,000 mg total) by mouth 2 (two) times daily with a meal.  Dispense: 120 tablet; Refill: 3 - empagliflozin (JARDIANCE) 10 MG TABS tablet; Take 10 mg by mouth daily.  Dispense: 30 tablet; Refill: 3  2. OSA (obstructive sleep apnea) 3. Body mass index (BMI) of 50.0 to 59.9 in adult [facility] - Prescription written and faxed to [place] for new CPAP   Mercer Pod, PA-C  Primary [facility] at [facility] 01/03/2018 5:10 PM  Please note: Portions of this report may have been transcribed using dragon voice recognition software. Every effort was made to ensure accuracy; however, inadvertent computerized transcription errors may be present.

## 2018-01-03 NOTE — Patient Instructions (Addendum)
Keep up the great work! Check out Diabetes.org  Start taking 1,046m twice daily of metformin. Continue Jardiance 143mdaily.   Lab Results  Component Value Date   HGBA1C 11.6 (A) 01/03/2018   I'd love to see your A1C <8.  Recheck in 3 months   Diabetes Mellitus and Exercise Exercising regularly is important for your overall health, especially when you have diabetes (diabetes mellitus). Exercising is not only about losing weight. It has many health benefits, such as increasing muscle strength and bone density and reducing body fat and stress. This leads to improved fitness, flexibility, and endurance, all of which result in better overall health. Exercise has additional benefits for people with diabetes, including:  Reducing appetite.  Helping to lower and control blood glucose.  Lowering blood pressure.  Helping to control amounts of fatty substances (lipids) in the blood, such as cholesterol and triglycerides.  Helping the body to respond better to insulin (improving insulin sensitivity).  Reducing how much insulin the body needs.  Decreasing the risk for heart disease by: ? Lowering cholesterol and triglyceride levels. ? Increasing the levels of good cholesterol. ? Lowering blood glucose levels.  What is my activity plan? Your health care provider or certified diabetes educator can help you make a plan for the type and frequency of exercise (activity plan) that works for you. Make sure that you:  Do at least 150 minutes of moderate-intensity or vigorous-intensity exercise each week. This could be brisk walking, biking, or water aerobics. ? Do stretching and strength exercises, such as yoga or weightlifting, at least 2 times a week. ? Spread out your activity over at least 3 days of the week.  Get some form of physical activity every day. ? Do not go more than 2 days in a row without some kind of physical activity. ? Avoid being inactive for more than 90 minutes at a  time. Take frequent breaks to walk or stretch.  Choose a type of exercise or activity that you enjoy, and set realistic goals.  Start slowly, and gradually increase the intensity of your exercise over time.  What do I need to know about managing my diabetes?  Check your blood glucose before and after exercising. ? If your blood glucose is higher than 240 mg/dL (13.3 mmol/L) before you exercise, check your urine for ketones. If you have ketones in your urine, do not exercise until your blood glucose returns to normal.  Know the symptoms of low blood glucose (hypoglycemia) and how to treat it. Your risk for hypoglycemia increases during and after exercise. Common symptoms of hypoglycemia can include: ? Hunger. ? Anxiety. ? Sweating and feeling clammy. ? Confusion. ? Dizziness or feeling light-headed. ? Increased heart rate or palpitations. ? Blurry vision. ? Tingling or numbness around the mouth, lips, or tongue. ? Tremors or shakes. ? Irritability.  Keep a rapid-acting carbohydrate snack available before, during, and after exercise to help prevent or treat hypoglycemia.  Avoid injecting insulin into areas of the body that are going to be exercised. For example, avoid injecting insulin into: ? The arms, when playing tennis. ? The legs, when jogging.  Keep records of your exercise habits. Doing this can help you and your health care provider adjust your diabetes management plan as needed. Write down: ? Food that you eat before and after you exercise. ? Blood glucose levels before and after you exercise. ? The type and amount of exercise you have done. ? When your insulin is expected to  peak, if you use insulin. Avoid exercising at times when your insulin is peaking.  When you start a new exercise or activity, work with your health care provider to make sure the activity is safe for you, and to adjust your insulin, medicines, or food intake as needed.  Drink plenty of water while you  exercise to prevent dehydration or heat stroke. Drink enough fluid to keep your urine clear or pale yellow. This information is not intended to replace advice given to you by your health care provider. Make sure you discuss any questions you have with your health care provider. Document Released: 06/10/2003 Document Revised: 10/08/2015 Document Reviewed: 08/30/2015 Elsevier Interactive Patient Education  [employer].  Thank you for coming in today. I hope you feel we met your needs.  Feel free to call PCP if you have any questions or further requests.  Please consider signing up for MyChart if you do not already have it, as this is a great way to communicate with me.  Best,  Whitney McVey, PA-C  IF you received an x-ray today, you will receive an invoice from [facility]. Please contact [facility] at [phone] with questions or concerns regarding your invoice.   IF you received labwork today, you will receive an invoice from [facility]. Please contact [facility] at [phone] with questions or concerns regarding your invoice.   Our billing staff will not be able to assist you with questions regarding bills from these companies.  You will be contacted with the lab results as soon as they are available. The fastest way to get your results is to activate your My Chart account. Instructions are located on the last page of this paperwork. If you have not heard from [place] regarding the results in 2 weeks, please contact this office.

## 2018-01-04 NOTE — Telephone Encounter (Signed)
Copied from [place] [id number]. Topic: General - Other >> Jan 04, 2018  1:56 PM Leward Quan A wrote: Reason for CRM: Barbaraann Rondo with [facility] called to say that patient will have to start her process over for Cpap therapy because she is outside of her thirty days. Say she need to have face to face visit all over again. Ph# [phone] [phone]

## 2018-01-05 NOTE — Telephone Encounter (Signed)
Copied from [place] [id number]. Topic: General - Other >> Jan 04, 2018  1:56 PM Leward Quan A wrote: Reason for CRM: Barbaraann Rondo with [facility] called to say that patient will have to start her process over for Cpap therapy because she is outside of her thirty days. Say she need to have face to face visit all over again. Ph# [phone] [phone]

## 2018-01-07 ENCOUNTER — Other Ambulatory Visit: Payer: Self-pay | Admitting: Physician Assistant

## 2018-01-07 DIAGNOSIS — I10 Essential (primary) hypertension: Secondary | ICD-10-CM

## 2018-01-07 MED ORDER — LOSARTAN POTASSIUM 100 MG PO TABS: ORAL_TABLET | ORAL | 2 refills | Status: DC

## 2018-01-09 ENCOUNTER — Ambulatory Visit: Payer: BLUE CROSS/BLUE SHIELD | Admitting: Physician Assistant

## 2018-01-26 ENCOUNTER — Encounter: Payer: Self-pay | Admitting: Physician Assistant

## 2018-01-27 ENCOUNTER — Other Ambulatory Visit: Payer: Self-pay | Admitting: Physician Assistant

## 2018-01-27 DIAGNOSIS — E119 Type 2 diabetes mellitus without complications: Secondary | ICD-10-CM

## 2018-01-28 NOTE — Telephone Encounter (Signed)
[place] called and spoke to [employer], Merchant navy officer. I asked about the Metformin refill. She says the patient picked up a 3 month supply on 12/07/17 from the prescription sent in on 12/06/17 #180/0 refills and should not need a refill until December. She also says the refill sent on 01/03/18 #120/3 refills is on file and will be refilled in December and can manually change the quantity to be a 3 month supply. Marland Kitchen

## 2018-02-01 ENCOUNTER — Encounter: Payer: Self-pay | Admitting: Physician Assistant

## 2018-02-04 ENCOUNTER — Ambulatory Visit: Payer: BLUE CROSS/BLUE SHIELD | Admitting: Family Medicine

## 2018-02-04 ENCOUNTER — Other Ambulatory Visit: Payer: Self-pay | Admitting: Physician Assistant

## 2018-02-04 ENCOUNTER — Encounter

## 2018-02-04 DIAGNOSIS — E119 Type 2 diabetes mellitus without complications: Secondary | ICD-10-CM

## 2018-02-04 MED ORDER — METFORMIN HCL ER 500 MG PO TB24: 1000.0000 mg | ORAL_TABLET | Freq: Two times a day (BID) | ORAL | 3 refills | Status: DC

## 2018-02-05 ENCOUNTER — Other Ambulatory Visit: Payer: Self-pay

## 2018-02-05 DIAGNOSIS — K219 Gastro-esophageal reflux disease without esophagitis: Secondary | ICD-10-CM

## 2018-02-05 MED ORDER — OMEPRAZOLE 40 MG PO CPDR: 40.0000 mg | DELAYED_RELEASE_CAPSULE | Freq: Every day | ORAL | 3 refills | Status: DC

## 2018-02-25 ENCOUNTER — Encounter: Payer: Self-pay | Admitting: Physician Assistant

## 2018-06-02 ENCOUNTER — Other Ambulatory Visit: Payer: Self-pay | Admitting: Physician Assistant

## 2018-06-02 DIAGNOSIS — E119 Type 2 diabetes mellitus without complications: Secondary | ICD-10-CM

## 2018-06-03 NOTE — Telephone Encounter (Signed)
Requested medication (s) are due for refill today: yes  Requested medication (s) are on the active medication list: yes  Last refill:  02/04/18 for 120 tabs and 3 refills  Future visit scheduled: no  Notes to clinic:  Diabetes - biguanides failed  Requested Prescriptions  Pending Prescriptions Disp Refills   metFORMIN (GLUCOPHAGE-XR) 500 MG 24 hr tablet [Pharmacy Med Name: METFORMIN ER 500MG 24HR TABS] 120 tablet 3    Sig: TAKE 2 TABLETS BY [place]     Endocrinology:  Diabetes - Biguanides Failed - 06/02/2018  3:13 AM      Failed - Cr in normal range and within 360 days    Creat  Date Value Ref Range Status  02/24/2015 0.72 0.50 - 1.10 mg/dL Final   Creatinine, Ser  Date Value Ref Range Status  11/21/2016 0.74 0.57 - 1.00 mg/dL Final         Failed - HBA1C is between 0 and 7.9 and within 180 days    Hemoglobin A1C  Date Value Ref Range Status  01/03/2018 11.6 (A) 4.0 - 5.6 % Final   HbA1c POC (<> result, manual entry)  Date Value Ref Range Status  09/25/2017 >14.0 4.0 - 5.6 % Corrected         Failed - eGFR in normal range and within 360 days    GFR calc Af Amer  Date Value Ref Range Status  11/21/2016 112 >59 mL/min/1.73 Final   GFR calc non Af Amer  Date Value Ref Range Status  11/21/2016 97 >59 mL/min/1.73 Final         Passed - Valid encounter within last 6 months    Recent Outpatient Visits          5 months ago Type 2 diabetes mellitus without complication, without long-term current use of insulin ([place])   Primary Care at [facility], [place], PA-C   5 months ago Rash and nonspecific skin eruption   Primary Care at [facility], [place], PA-C   8 months ago Chronic obstructive pulmonary disease, unspecified COPD type [facility])   Primary Care at [facility], Gelene Mink, PA-C   1 year ago Type 2 diabetes mellitus without complication, without long-term current use of insulin ([place])   Primary Care at [facility],  [place], [place]   1 year ago Type 2 diabetes mellitus without complication, without long-term current use of insulin [facility])   Primary Care at [facility], [place], [place]

## 2018-06-11 ENCOUNTER — Other Ambulatory Visit: Payer: Self-pay | Admitting: Physician Assistant

## 2018-06-11 DIAGNOSIS — E119 Type 2 diabetes mellitus without complications: Secondary | ICD-10-CM

## 2018-06-11 NOTE — Telephone Encounter (Signed)
Requested Prescriptions  Pending Prescriptions Disp Refills  . metFORMIN (GLUCOPHAGE-XR) 500 MG 24 hr tablet [Pharmacy Med Name: METFORMIN ER 500MG 24HR TABS] 120 tablet 3    Sig: TAKE 2 TABLETS BY MOUTH TWICE DAILY WITH MEALS     Endocrinology:  Diabetes - Biguanides Failed - 06/11/2018  8:34 AM      Failed - Cr in normal range and within 360 days    Creat  Date Value Ref Range Status  02/24/2015 0.72 0.50 - 1.10 mg/dL Final   Creatinine, Ser  Date Value Ref Range Status  11/21/2016 0.74 0.57 - 1.00 mg/dL Final         Failed - HBA1C is between 0 and 7.9 and within 180 days    Hemoglobin A1C  Date Value Ref Range Status  01/03/2018 11.6 (A) 4.0 - 5.6 % Final   HbA1c POC (<> result, manual entry)  Date Value Ref Range Status  09/25/2017 >14.0 4.0 - 5.6 % Corrected         Failed - eGFR in normal range and within 360 days    GFR calc Af Amer  Date Value Ref Range Status  11/21/2016 112 >59 mL/min/1.73 Final   GFR calc non Af Amer  Date Value Ref Range Status  11/21/2016 97 >59 mL/min/1.73 Final         Passed - Valid encounter within last 6 months    Recent Outpatient Visits          5 months ago Type 2 diabetes mellitus without complication, without long-term current use of insulin ([place])   Primary Care at [facility], [place], PA-C   5 months ago Rash and nonspecific skin eruption   Primary Care at [facility], [place], PA-C   8 months ago Chronic obstructive pulmonary disease, unspecified COPD type [facility])   Primary Care at [facility], Gelene Mink, PA-C   1 year ago Type 2 diabetes mellitus without complication, without long-term current use of insulin ([place])   Primary Care at [facility], [place], [place]   1 year ago Type 2 diabetes mellitus without complication, without long-term current use of insulin ([place])   Primary Care at [facility], [place], [place]           Courtesy refill given for 30 day supply; needs office visit; left message  for pt. To call to schedule f/u appt.

## 2018-07-02 DIAGNOSIS — J40 Bronchitis, not specified as acute or chronic: Secondary | ICD-10-CM | POA: Diagnosis not present

## 2018-07-02 DIAGNOSIS — Z7189 Other specified counseling: Secondary | ICD-10-CM | POA: Diagnosis not present

## 2018-07-02 DIAGNOSIS — I10 Essential (primary) hypertension: Secondary | ICD-10-CM | POA: Diagnosis not present

## 2018-07-16 ENCOUNTER — Other Ambulatory Visit: Payer: Self-pay

## 2018-07-16 ENCOUNTER — Telehealth (INDEPENDENT_AMBULATORY_CARE_PROVIDER_SITE_OTHER): Payer: BLUE CROSS/BLUE SHIELD | Admitting: Family Medicine

## 2018-07-16 DIAGNOSIS — I10 Essential (primary) hypertension: Secondary | ICD-10-CM

## 2018-07-16 DIAGNOSIS — R21 Rash and other nonspecific skin eruption: Secondary | ICD-10-CM

## 2018-07-16 DIAGNOSIS — K219 Gastro-esophageal reflux disease without esophagitis: Secondary | ICD-10-CM

## 2018-07-16 DIAGNOSIS — E119 Type 2 diabetes mellitus without complications: Secondary | ICD-10-CM

## 2018-07-16 DIAGNOSIS — E1165 Type 2 diabetes mellitus with hyperglycemia: Secondary | ICD-10-CM | POA: Diagnosis not present

## 2018-07-16 MED ORDER — MUPIROCIN 2 % EX OINT: 1.0000 "application " | TOPICAL_OINTMENT | Freq: Three times a day (TID) | CUTANEOUS | 1 refills | Status: AC

## 2018-07-16 MED ORDER — LOSARTAN POTASSIUM 100 MG PO TABS: ORAL_TABLET | ORAL | 2 refills | Status: AC

## 2018-07-16 MED ORDER — OMEPRAZOLE 40 MG PO CPDR: 40.0000 mg | DELAYED_RELEASE_CAPSULE | Freq: Every day | ORAL | 3 refills | Status: AC

## 2018-07-16 MED ORDER — METFORMIN HCL ER 500 MG PO TB24: 1000.0000 mg | ORAL_TABLET | Freq: Two times a day (BID) | ORAL | 6 refills | Status: AC

## 2018-07-16 NOTE — Progress Notes (Signed)
Telemedicine Encounter- SOAP NOTE Established Patient  This telephone encounter was conducted with the patient's (or proxy's) verbal consent via audio telecommunications: yes/no: Yes Patient was instructed to have this encounter in a suitably private space; and to only have persons present to whom they give permission to participate. In addition, patient identity was confirmed by use of name plus two identifiers (DOB and address).  I discussed the limitations, risks, security and privacy concerns of performing an evaluation and management service by telephone and the availability of in person appointments. I also discussed with the patient that there may be a patient responsible charge related to this service. The patient expressed understanding and agreed to proceed.  I spent a total of TIME; 0 MIN TO 60 MIN: 25 minutes talking with the patient or their proxy.  CC:  Subjective   Tonya Moore is a 48 y.o. established patient. Telephone visit today for  HPI   Diabetes and skin rash Patient reports hives while on Jardiance and could not take the medication. She is not taking farxiga. She had a rash on her face broke out in a rash and she believes it is due to the medications she is currently taking. She does not "want to be a [place] pig regarding medications" She states that she feels like nothing is working She does not want to take any additional medications other than metformin and thinks that lifestyle modification can help her.  Lab Results  Component Value Date   HGBA1C 11.6 (A) 01/03/2018   Lab Results  Component Value Date   HGBA1C 13.9 (H) 07/18/2018     Hypertension BP Readings from Last 3 Encounters:  09/16/18 138/88  01/03/18 130/90  12/19/17 (!) 153/98   Hypertension: Patient here for follow-up of elevated blood pressure. She is not exercising and is not adherent to low salt diet.  Blood pressure is well controlled at home. Cardiac symptoms none. Patient denies  chest pain, claudication, dyspnea, fatigue, irregular heart beat, near-syncope and orthopnea.  Cardiovascular risk factors: diabetes mellitus, hypertension and obesity (BMI >= 30 kg/m2). Use of agents associated with hypertension: none. History of target organ damage: none.  She also had a bad reaction to lisinopril and is now on losartan      Patient Active Problem List   Diagnosis Date Noted  . FH: colon cancer in first degree relative <69 years old 07/30/2018  . Rash, vesicular 07/30/2018  . Skin-picking disorder 07/30/2018  . Type 2 diabetes mellitus with hyperglycemia, without long-term current use of insulin ([place]) 07/30/2018  . Hyperlipemia 07/25/2016  . Esophageal reflux 08/18/2014  . Anemia, iron deficiency 08/18/2014  . Type 2 diabetes mellitus without complication ([place]) 27/09/2374  . Hypertension 01/01/2013  . OSA (obstructive sleep apnea)   . Body mass index (BMI) of 50.0 to 59.9 in adult [facility])   . COPD (chronic obstructive pulmonary disease) ([facility])     Past Medical History:  Diagnosis Date  . Anemia   . COPD (chronic obstructive pulmonary disease) ([place]) 2005  . GERD (gastroesophageal reflux disease)   . Hypertension   . Menometrorrhagia   . Obesity   . OSA on CPAP     Current Outpatient Medications  Medication Sig Dispense Refill  . Blood Glucose Monitoring Suppl (ACCU-CHEK AVIVA) device 1 each by Other route daily. 1 each 0  . Ferrous Gluconate 325 (36 FE) MG TABS Take 1 tablet by mouth 2 (two) times daily. Take with a stool softener (Patient not taking: Reported  on 01/03/2018) 60 tablet 5  . glipiZIDE (GLUCOTROL) 10 MG tablet Take 1 tablet (10 mg total) by mouth 2 (two) times daily before a meal. 60 tablet 3  . glucose blood (ACCU-CHEK AVIVA) test strip 2x daily 100 each 12  . losartan (COZAAR) 100 MG tablet TAKE 1 TABLET(100 MG) BY MOUTH DAILY 90 tablet 2  . metFORMIN (GLUCOPHAGE-XR) 500 MG 24 hr tablet Take 2 tablets (1,000 mg total) by mouth 2 (two) times daily  with a meal. 120 tablet 6  . Multiple Vitamin (MULTIVITAMIN) tablet Take 1 tablet by mouth daily.    . mupirocin ointment (BACTROBAN) 2 % Apply 1 application topically 3 (three) times daily. 30 g 1  . omeprazole (PRILOSEC) 40 MG capsule Take 1 capsule (40 mg total) by mouth daily. 90 capsule 3  . triamcinolone (KENALOG) 0.025 % ointment Apply 1 application topically 2 (two) times daily. (Patient not taking: Reported on 07/30/2018) 30 g 0   No current facility-administered medications for this visit.     Allergies  Allergen Reactions  . Ace Inhibitors     Tongue swelling on Lisinopril  . Jardiance [Empagliflozin] Hives    Hives and skin eruptions  . Metformin And Related Nausea Only    Social History   Socioeconomic History  . Marital status: Single    Spouse name: n/a  . Number of children: 0  . Years of education: 57  . Highest education level: Not on file  Occupational History  . Occupation: Tree surgeon: [employer]  . Financial resource strain: Not on file  . Food insecurity    Worry: Not on file    Inability: Not on file  . Transportation needs    Medical: Not on file    Non-medical: Not on file  Tobacco Use  . Smoking status: Former Smoker    Types: Cigarettes    Quit date: 02/23/2004    Years since quitting: 14.5  . Smokeless tobacco: Never Used  Substance and Sexual Activity  . Alcohol use: No    Alcohol/week: 0.0 standard drinks    Comment: rare  . Drug use: No  . Sexual activity: Never    Partners: Male    Birth control/protection: Condom  Lifestyle  . Physical activity    Days per week: Not on file    Minutes per session: Not on file  . Stress: Not on file  Relationships  . Social Herbalist on phone: Not on file    Gets together: Not on file    Attends religious service: Not on file    Active member of club or organization: Not on file    Attends meetings of clubs or organizations: Not on  file    Relationship status: Not on file  . Intimate partner violence    Fear of current or ex partner: Not on file    Emotionally abused: Not on file    Physically abused: Not on file    Forced sexual activity: Not on file  Other Topics Concern  . Not on file  Social History Narrative   Lives alone with her cat.    ROS Review of Systems  Constitutional: Negative for activity change, appetite change, chills and fever.  HENT: Negative for congestion, nosebleeds, trouble swallowing and voice change.   Respiratory: Negative for cough, shortness of breath and wheezing.   Gastrointestinal: Negative for diarrhea, nausea and vomiting.  Genitourinary: Negative for difficulty  urinating, dysuria, flank pain and hematuria.  Musculoskeletal: Negative for back pain, joint swelling and neck pain.  Neurological: Negative for dizziness, speech difficulty, light-headedness and numbness.  See HPI. All other review of systems negative.   Objective   Vitals as reported by the patient: There were no vitals filed for this visit.  Diagnoses and all orders for this visit:  Rash and nonspecific skin eruption- will treat empirically -     mupirocin ointment (BACTROBAN) 2 %; Apply 1 application topically 3 (three) times daily.  Essential hypertension- Patient's blood pressure is at goal of 139/89 or less. Condition is stable. Continue current medications and treatment plan. I recommend that you exercise for 30-45 minutes 5 days a week. I also recommend a balanced diet with fruits and vegetables every day, lean meats, and little fried foods. The DASH diet (you can find this online) is a good example of this.  -     losartan (COZAAR) 100 MG tablet; TAKE 1 TABLET(100 MG) BY MOUTH DAILY  Type 2 diabetes mellitus with hyperglycemia, without long-term current use of insulin ([place])- not at goal, discussed that she will need an additional agent. Pt refused. At this time will do 1021m bid  -     metFORMIN  (GLUCOPHAGE-XR) 500 MG 24 hr tablet; Take 2 tablets (1,000 mg total) by mouth 2 (two) times daily with a meal. -     Ambulatory referral to Endocrinology; Future -     CMP14+EGFR; Future -     Hemoglobin A1c; Future -     Lipid panel; Future  Gastroesophageal reflux disease without esophagitis -     omeprazole (PRILOSEC) 40 MG capsule; Take 1 capsule (40 mg total) by mouth daily.     I discussed the assessment and treatment plan with the patient. The patient was provided an opportunity to ask questions and all were answered. The patient agreed with the plan and demonstrated an understanding of the instructions.   The patient was advised to call back or seek an in-person evaluation if the symptoms worsen or if the condition fails to improve as anticipated.  I provided 25 minutes of non-face-to-face time during this encounter.  ZForrest Moron MD  Primary Care at [facility]

## 2018-07-16 NOTE — Progress Notes (Signed)
Pt is needing refill son medications. She is not taking the jardiance nor farxiga and does not want to take the meds. There was an outbreak on her face and elbows that she believes was due to the meds. Says that you were aware of this. She believes with continual diet change she will be just fine with taking the metformin alone. Went to urgent care last week, given doxycycline and prednisone for her respiratory symptoms. Says she feels better but not 100%. Thinks she needs to get a humidifier in her home due to moisture issue. Otherwise no medical concerns today. No travel, anxiety or depression. She will be buying a bp cuff for the home.

## 2018-07-18 ENCOUNTER — Ambulatory Visit (INDEPENDENT_AMBULATORY_CARE_PROVIDER_SITE_OTHER): Payer: BLUE CROSS/BLUE SHIELD | Admitting: Family Medicine

## 2018-07-18 ENCOUNTER — Other Ambulatory Visit: Payer: Self-pay

## 2018-07-18 DIAGNOSIS — E1165 Type 2 diabetes mellitus with hyperglycemia: Secondary | ICD-10-CM

## 2018-07-18 LAB — CMP14+EGFR
ALT: 21 IU/L (ref 0–32)
AST: 22 IU/L (ref 0–40)
Albumin/Globulin Ratio: 1.5 (ref 1.2–2.2)
Albumin: 3.7 g/dL — ABNORMAL LOW (ref 3.8–4.8)
Alkaline Phosphatase: 89 IU/L (ref 39–117)
BUN/Creatinine Ratio: 23 (ref 9–23)
BUN: 16 mg/dL (ref 6–24)
Bilirubin Total: 0.6 mg/dL (ref 0.0–1.2)
CO2: 20 mmol/L (ref 20–29)
Calcium: 8.9 mg/dL (ref 8.7–10.2)
Chloride: 100 mmol/L (ref 96–106)
Creatinine, Ser: 0.69 mg/dL (ref 0.57–1.00)
GFR calc Af Amer: 119 mL/min/{1.73_m2} (ref >59)
GFR calc non Af Amer: 103 mL/min/{1.73_m2} (ref >59)
Globulin, Total: 2.4 g/dL (ref 1.5–4.5)
Glucose: 319 mg/dL — ABNORMAL HIGH (ref 65–99)
Potassium: 4.5 mmol/L (ref 3.5–5.2)
Sodium: 135 mmol/L (ref 134–144)
Total Protein: 6.1 g/dL (ref 6.0–8.5)

## 2018-07-18 LAB — LIPID PANEL
Chol/HDL Ratio: 3.5 ratio (ref 0.0–4.4)
Cholesterol, Total: 145 mg/dL (ref 100–199)
HDL: 42 mg/dL (ref >39)
LDL Calculated: 85 mg/dL (ref 0–99)
Triglycerides: 91 mg/dL (ref 0–149)
VLDL Cholesterol Cal: 18 mg/dL (ref 5–40)

## 2018-07-18 LAB — HEMOGLOBIN A1C
Est. average glucose Bld gHb Est-mCnc: 352 mg/dL
Hgb A1c MFr Bld: 13.9 % — ABNORMAL HIGH (ref 4.8–5.6)

## 2018-07-20 ENCOUNTER — Encounter: Payer: Self-pay | Admitting: Internal Medicine

## 2018-07-30 ENCOUNTER — Encounter: Payer: Self-pay | Admitting: Internal Medicine

## 2018-07-30 ENCOUNTER — Ambulatory Visit (INDEPENDENT_AMBULATORY_CARE_PROVIDER_SITE_OTHER): Payer: BLUE CROSS/BLUE SHIELD | Admitting: Internal Medicine

## 2018-07-30 ENCOUNTER — Ambulatory Visit: Payer: BLUE CROSS/BLUE SHIELD | Admitting: Internal Medicine

## 2018-07-30 ENCOUNTER — Other Ambulatory Visit: Payer: Self-pay

## 2018-07-30 DIAGNOSIS — E1165 Type 2 diabetes mellitus with hyperglycemia: Secondary | ICD-10-CM | POA: Diagnosis not present

## 2018-07-30 DIAGNOSIS — R238 Other skin changes: Secondary | ICD-10-CM | POA: Diagnosis not present

## 2018-07-30 DIAGNOSIS — F424 Excoriation (skin-picking) disorder: Secondary | ICD-10-CM

## 2018-07-30 DIAGNOSIS — Z800 Family history of malignant neoplasm of digestive organs: Secondary | ICD-10-CM | POA: Insufficient documentation

## 2018-07-30 MED ORDER — GLUCOSE BLOOD VI STRP: ORAL_STRIP | 12 refills | Status: AC

## 2018-07-30 MED ORDER — GLIPIZIDE 5 MG PO TABS: 5.0000 mg | ORAL_TABLET | Freq: Two times a day (BID) | ORAL | 3 refills | Status: DC

## 2018-07-30 MED ORDER — ACCU-CHEK AVIVA DEVI: 1.0000 | Freq: Every day | 0 refills | Status: AC

## 2018-07-30 NOTE — Progress Notes (Deleted)
Virtual Visit via Video Note  I connected with@ on 07/30/18 at  8:10 AM EDT by a video enabled telemedicine application and verified that I am speaking with the correct person using two identifiers.   I discussed the limitations of evaluation and management by telemedicine and the availability of in person appointments. The patient expressed understanding and agreed to proceed.  -Location of the patient : -Location of the provider : office  -The names of all persons participating in the telemedicine service : Pt and myself     Name: Tonya Moore  MRN/ DOB: [id number], 1970/07/05   Age/ Sex: 48 y.o., female    PCP: Dorise Hiss, PA-C   Reason for Endocrinology Evaluation: Type 2 Diabetes Mellitus     Date of Initial Endocrinology Visit: 07/30/2018     PATIENT IDENTIFIER: Tonya Moore is a 48 y.o. female with a past medical history of HTN, T2DM, Hyperlipidemia, COPD and OSA. The patient presented for initial endocrinology clinic visit on 07/30/2018 for consultative assistance with her diabetes management.    HPI: Tonya Moore was    Diagnosed with T2DM in 2014 Prior Medications tried/Intolerance: Metformin- Nausea, Jardiance - Hives Currently checking blood sugars *** x / day,  before breakfast and ***.  Hypoglycemia episodes : ***               Symptoms: ***                 Frequency: ***/  Hemoglobin A1c has ranged from 7.3% in 2014, peaking at > 15.5 %  In 2018. Patient required assistance for hypoglycemia:  Patient has required hospitalization within the last 1 year from hyper or hypoglycemia:   In terms of diet, the patient ***   HOME DIABETES REGIMEN: Metformin 500 mg XR 2 tabs BID   Statin: no ACE-I/ARB: yes Prior Diabetic Education: {Yes/No:11203}  GLUCOSE LOG:    DIABETIC COMPLICATIONS: Microvascular complications:   ***  Denies: ***  Last eye exam: Completed   Macrovascular complications:   ***  Denies: CAD, PVD, CVA   PAST  HISTORY: Past Medical History:  Past Medical History:  Diagnosis Date  . Anemia   . COPD (chronic obstructive pulmonary disease) ([place]) 2005  . GERD (gastroesophageal reflux disease)   . Hypertension   . Menometrorrhagia   . Obesity   . OSA on CPAP      Past Surgical History: No past surgical history on file.   Social History:  reports that she quit smoking about 14 years ago. Her smoking use included cigarettes. She has never used smokeless tobacco. She reports that she does not drink alcohol or use drugs. Family History:  Family History  Problem Relation Age of Onset  . Cancer Mother        colorectal Ca  . Diabetes Mother       HOME MEDICATIONS: Allergies as of 07/30/2018      Reactions   Ace Inhibitors    Tongue swelling on Lisinopril   Jardiance [empagliflozin] Hives   Hives and skin eruptions   Metformin And Related Nausea Only      Medication List       Accurate as of July 30, 2018  7:33 AM. Always use your most recent med list.        Ferrous Gluconate 325 (36 Fe) MG Tabs Take 1 tablet by mouth 2 (two) times daily. Take with a stool softener   losartan 100 MG tablet Commonly known as:  COZAAR TAKE 1 TABLET(100 MG) BY MOUTH DAILY   metFORMIN 500 MG 24 hr tablet Commonly known as:  GLUCOPHAGE-XR Take 2 tablets (1,000 mg total) by mouth 2 (two) times daily with a meal.   multivitamin tablet Take 1 tablet by mouth daily.   mupirocin ointment 2 % Commonly known as:  BACTROBAN Apply 1 application topically 3 (three) times daily.   omeprazole 40 MG capsule Commonly known as:  PRILOSEC Take 1 capsule (40 mg total) by mouth daily.   triamcinolone 0.025 % ointment Commonly known as:  KENALOG Apply 1 application topically 2 (two) times daily.        ALLERGIES: Allergies  Allergen Reactions  . Ace Inhibitors     Tongue swelling on Lisinopril  . Jardiance [Empagliflozin] Hives    Hives and skin eruptions  . Metformin And Related Nausea Only      REVIEW OF SYSTEMS: A comprehensive ROS was conducted with the patient and is negative except as per HPI and below:  ROS      DATA REVIEWED:  Lab Results  Component Value Date   HGBA1C 13.9 (H) 07/18/2018   HGBA1C 11.6 (A) 01/03/2018   HGBA1C 14.0 (A) 09/25/2017   HGBA1C >14.0 09/25/2017   Lab Results  Component Value Date   MICROALBUR 6.3 08/13/2015   LDLCALC 85 07/18/2018   CREATININE 0.69 07/18/2018   No results found for: MICRALBCREAT  Lab Results  Component Value Date   CHOL 145 07/18/2018   HDL 42 07/18/2018   LDLCALC 85 07/18/2018   TRIG 91 07/18/2018   CHOLHDL 3.5 07/18/2018        ASSESSMENT / PLAN / RECOMMENDATIONS:   1) Type 2 Diabetes Mellitus, Poorly controlled, With*** complications - Most recent A1c of *** %. Goal A1c < *** %.  ***  Plan: GENERAL:  ***  MEDICATIONS:  ***  EDUCATION / INSTRUCTIONS:  BG monitoring instructions: Patient is instructed to check her blood sugars *** times a day, ***.  Call [facility] if: BG persistently < 70 or > 300. . I reviewed the Rule of 15 for the treatment of hypoglycemia in detail with the patient. Literature supplied.   2) Diabetic complications:   Eye: Does *** have known diabetic retinopathy.   Neuro/ Feet: Does *** have known diabetic peripheral neuropathy.  Renal: Patient does *** have known baseline CKD. She is *** on an ACEI/ARB at present.   3) Lipids: Patient is not on a statin.    4) Hypertension: ***  at goal of < 140/90 mmHg.        I discussed the assessment and treatment plan with the patient. The patient was provided an opportunity to ask questions and all were answered. The patient agreed with the plan and demonstrated an understanding of the instructions.   The patient was advised to call back or seek an in-person evaluation if the symptoms worsen or if the condition fails to improve as anticipated.  I provided *** minutes of non-face-to-face time  during this encounter.   Signed electronically by: Mack Guise, MD  [facility]  [facility] [address] [place], [place] [address] Phone: [phone] FAX: [phone]   CC: Dorise Hiss, PA-C [place] [address] Phone: [phone] Fax: [phone]   Return to Endocrinology clinic as below: Future Appointments  Date Time Provider [place]  07/30/2018  8:10 AM , Melanie Crazier, MD [facility] None

## 2018-07-30 NOTE — Progress Notes (Signed)
Virtual Visit via Video Note  I connected with Tonya Moore on 07/30/18 at  8:10 AM EDT by a video enabled telemedicine application and verified that I am speaking with the correct person using two identifiers.   I discussed the limitations of evaluation and management by telemedicine and the availability of in person appointments. The patient expressed understanding and agreed to proceed.  -Location of the patient : Car -Location of the provider : office  -The names of all persons participating in the telemedicine service : Pt and myself     Name: Tonya Moore  MRN/ DOB: [id number], 12-01-1970   Age/ Sex: 48 y.o., female    PCP: Tonya Hiss, PA-C   Reason for Endocrinology Evaluation: Type 2 Diabetes Mellitus     Date of Initial Endocrinology Visit: 07/30/2018     PATIENT IDENTIFIER: Tonya Moore is a 48 y.o. female with a past medical history of HTN, T2DM, Hyperlipidemia, COPD and OSA. The patient presented for initial endocrinology clinic visit on 07/30/2018 for consultative assistance with her diabetes management.    HPI: Tonya Moore was    Diagnosed with T2DM in 2014 Prior Medications tried/Intolerance: Jardiance - Hives Currently checking blood sugars 0 x / day Hypoglycemia episodes : no         Hemoglobin A1c has ranged from 7.3% in 2014, peaking at > 15.5 %  In 2018. Patient required assistance for hypoglycemia: no Patient has required hospitalization within the last 1 year from hyper or hypoglycemia: no  In terms of diet, the patient eats 3 meals a day, she snacks , she loves sweets and popcorn.   She developed a rash after started Jardiance but has been off of it for ~ 4 month with continued pruritic rash on her elbows and face. The rash initially started on her elbows.   Mother was diagnosed with colon cancer    HOME DIABETES REGIMEN: Metformin 500 mg XR 2 tabs BID   Statin: no ACE-I/ARB: yes Prior Diabetic Education: no  GLUCOSE  LOG:  N/A   DIABETIC COMPLICATIONS: Microvascular complications:    Denies: retinopathy, CKD, neropathy  Last eye exam: Completed 2019  Macrovascular complications:    Denies: CAD, PVD, CVA   PAST HISTORY: Past Medical History:  Past Medical History:  Diagnosis Date  . Anemia   . COPD (chronic obstructive pulmonary disease) ([place]) 2005  . GERD (gastroesophageal reflux disease)   . Hypertension   . Menometrorrhagia   . Obesity   . OSA on CPAP     Past Surgical History: No past surgical history on file.   Social History:  reports that she quit smoking about 14 years ago. Her smoking use included cigarettes. She has never used smokeless tobacco. She reports that she does not drink alcohol or use drugs. Family History:  Family History  Problem Relation Age of Onset  . Cancer Mother        colorectal Ca  . Diabetes Mother      HOME MEDICATIONS: Allergies as of 07/30/2018      Reactions   Ace Inhibitors    Tongue swelling on Lisinopril   Jardiance [empagliflozin] Hives   Hives and skin eruptions   Metformin And Related Nausea Only      Medication List       Accurate as of July 30, 2018  2:41 PM. Always use your most recent med list.        Accu-Chek Aviva device 1 each by Other  route daily.   Ferrous Gluconate 325 (36 Fe) MG Tabs Take 1 tablet by mouth 2 (two) times daily. Take with a stool softener   glipiZIDE 5 MG tablet Commonly known as:  GLUCOTROL Take 1 tablet (5 mg total) by mouth 2 (two) times daily before a meal.   glucose blood test strip Commonly known as:  Accu-Chek Aviva 2x daily   losartan 100 MG tablet Commonly known as:  COZAAR TAKE 1 TABLET(100 MG) BY MOUTH DAILY   metFORMIN 500 MG 24 hr tablet Commonly known as:  GLUCOPHAGE-XR Take 2 tablets (1,000 mg total) by mouth 2 (two) times daily with a meal.   multivitamin tablet Take 1 tablet by mouth daily.   mupirocin ointment 2 % Commonly known as:  BACTROBAN Apply 1  application topically 3 (three) times daily.   omeprazole 40 MG capsule Commonly known as:  PRILOSEC Take 1 capsule (40 mg total) by mouth daily.   triamcinolone 0.025 % ointment Commonly known as:  KENALOG Apply 1 application topically 2 (two) times daily.        ALLERGIES: Allergies  Allergen Reactions  . Ace Inhibitors     Tongue swelling on Lisinopril  . Jardiance [Empagliflozin] Hives    Hives and skin eruptions  . Metformin And Related Nausea Only     REVIEW OF SYSTEMS: A comprehensive ROS was conducted with the patient and is negative except as per HPI and below:  Review of Systems  Constitutional: Positive for weight loss. Negative for fever.  HENT: Negative for congestion and sore throat.   Eyes: Positive for blurred vision. Negative for pain.  Respiratory: Positive for cough and shortness of breath.   Cardiovascular: Negative for chest pain and palpitations.  Gastrointestinal: Positive for constipation. Negative for diarrhea and nausea.  Genitourinary: Positive for frequency.  Musculoskeletal: Positive for joint pain.  Skin: Positive for itching and rash.  Neurological: Positive for tingling. Negative for tremors.  Endo/Heme/Allergies: Positive for polydipsia.  Psychiatric/Behavioral: Negative for depression. The patient is nervous/anxious.         DATA REVIEWED:  Lab Results  Component Value Date   HGBA1C 13.9 (H) 07/18/2018   HGBA1C 11.6 (A) 01/03/2018   HGBA1C 14.0 (A) 09/25/2017   HGBA1C >14.0 09/25/2017   Lab Results  Component Value Date   MICROALBUR 6.3 08/13/2015   LDLCALC 85 07/18/2018   CREATININE 0.69 07/18/2018       Lab Results  Component Value Date   CHOL 145 07/18/2018   HDL 42 07/18/2018   LDLCALC 85 07/18/2018   TRIG 91 07/18/2018   CHOLHDL 3.5 07/18/2018        ASSESSMENT / PLAN / RECOMMENDATIONS:   1) Type 2 Diabetes Mellitus, Poorly controlled, Without complications - Most recent A1c of 13.9 %. Goal A1c < 7.0 %.     Plan: GENERAL: I have discussed with the patient the pathophysiology of diabetes. We went over the natural progression of the disease. We talked about both insulin resistance and insulin deficiency. We stressed the importance of lifestyle changes including diet and exercise. I explained the complications associated with diabetes including retinopathy, nephropathy, neuropathy as well as increased risk of cardiovascular disease. We went over the benefit seen with glycemic control.    I explained to the patient that diabetic patients are at higher than normal risk for amputations. The patient was informed that diabetes is the number one cause of non-traumatic amputations in [place].    For years she has been avoiding aggressive intervention  to her diabetes management, she is trying to blame stress on causing diabetes years ago. While discussing the risk of cardiovascular disease with diabetes, pt started talking about all the de- stressing techniques she performs at home that she believes is helping her. I went in to depth about plaque formation etc in causing CVD etc to give her the picture.   I have explained to her that my first recommendations would be to start her on a multiple daily injections of insulin, she has been hyperglycemic for years and is glucose toxic and probably her insulin levels are very low at this time due to glucose toxicity, we also discussed other options such as long acting insulin with add on therapy of another glycemic agents  After a long discussion about all the classes , their risks and benefits she opted to try Glipizide , we discussed the risk of weight gain.   She was advised to contact me in a 2 weeks if her BG's continue to be > 200 mg./dL. I am not sure oral glycemic agents are going to work at this time, but she would like to see for herself.   We discussed the importance of checking glucose at home and the availability of this data to me to  Make informed  medication changes. She was sent a prescription of a glucose meter, I offered her training through our office but she is going to try a yourtube video first.   MEDICATIONS:  Metformin 1000 mg BID  Glipizide 5 mg BID with meals  EDUCATION / INSTRUCTIONS:  BG monitoring instructions: Patient is instructed to check her blood sugars 2 times a day, fasting and supper.  Call [facility] if: BG persistently < 70 or > 300. . I reviewed the Rule of 15 for the treatment of hypoglycemia in detail with the patient. Literature supplied.   2) Diabetic complications:   Eye: Does not have known diabetic retinopathy.   Neuro/ Feet: Does not have known diabetic peripheral neuropathy.  Renal: Patient does not have known baseline CKD. She is on an ACEI/ARB at present.   3) Lipids: Patient is not on a statin. I have advised her the ADA recommendations of being on a statin despite her low LDL to reduce risk of CVD.    4) Rash :   - She initially thought this was due to [facility] but she has been off of it for 4 months without improvement. I explained to her this rash is not related to Jardiance, as it should have resolved after 4 months of discontinuation.  - Her facial rash looks more like she is picking her skin, which she did admit to.  - Her elbow rash was hard to see through the camera but she described it as pruritic and looks like a cold sore or something like that. - My differential includes dermatitis herpitoformis and will consider checking her for celiac disease should this continue.     5) First Degree Relative With Colon Cancer:  - Her mother was diagnosed with colon cancer at age 13. Pt under the impression that taking metamucil is protective.  - I have explained to her that she is 3 yrs behind on a colonoscopy. I have strongly urged her to discuss cancer screening with her PCP, as apparently she has not had mammogram .   6) OSA:   - She is not on treatment due to  malfunction in her machine. I have discussed the increased risk of cardiovascular disease  in untreated OSA and encouraged her to proceed with treatment per her PCP's recommendations.   I discussed the assessment and treatment plan with the patient. The patient was provided an opportunity to ask questions and all were answered. The patient agreed with the plan and demonstrated an understanding of the instructions.   The patient was advised to call back or seek an in-person evaluation if the symptoms worsen or if the condition fails to improve as anticipated.   F/u in 6 weeks    Signed electronically by: Mack Guise, MD  [facility]  [facility] [address] [place], [place] [address] Phone: [phone] FAX: [phone]   CC: Tonya Hiss, PA-C [place] [address] Phone: [phone] Fax: [phone]   Return to Endocrinology clinic as below: No future appointments.

## 2018-08-08 ENCOUNTER — Encounter: Payer: Self-pay | Admitting: Internal Medicine

## 2018-08-12 ENCOUNTER — Encounter: Payer: Self-pay | Admitting: Internal Medicine

## 2018-08-13 NOTE — Telephone Encounter (Signed)
LM for pt to call back so I can review one touch meter stuff and get the best email for the patient

## 2018-09-03 DIAGNOSIS — E119 Type 2 diabetes mellitus without complications: Secondary | ICD-10-CM | POA: Diagnosis not present

## 2018-09-03 DIAGNOSIS — R0602 Shortness of breath: Secondary | ICD-10-CM | POA: Diagnosis not present

## 2018-09-03 DIAGNOSIS — I10 Essential (primary) hypertension: Secondary | ICD-10-CM | POA: Diagnosis not present

## 2018-09-03 DIAGNOSIS — G4733 Obstructive sleep apnea (adult) (pediatric): Secondary | ICD-10-CM | POA: Diagnosis not present

## 2018-09-10 ENCOUNTER — Other Ambulatory Visit: Payer: Self-pay

## 2018-09-10 DIAGNOSIS — G4761 Periodic limb movement disorder: Secondary | ICD-10-CM | POA: Diagnosis not present

## 2018-09-10 DIAGNOSIS — R0902 Hypoxemia: Secondary | ICD-10-CM | POA: Diagnosis not present

## 2018-09-10 DIAGNOSIS — G4733 Obstructive sleep apnea (adult) (pediatric): Secondary | ICD-10-CM | POA: Diagnosis not present

## 2018-09-12 ENCOUNTER — Ambulatory Visit: Payer: BLUE CROSS/BLUE SHIELD | Admitting: Internal Medicine

## 2018-09-12 NOTE — Progress Notes (Deleted)
Name: Tonya Moore  Age/ Sex: 48 y.o., female   MRN/ DOB: [id number], 05-15-1970     PCP: Dorise Hiss, PA-C   Reason for Endocrinology Evaluation: Type 2 Diabetes Mellitus  Initial Endocrine Consultative Visit: 07/30/2018    PATIENT IDENTIFIER: [name] is a 48 y.o. female with a past medical history of HTN, T2DM, Hyperlipidemia, COPD and OSA . The patient has followed with Endocrinology clinic since 07/30/2018 for consultative assistance with management of her diabetes.  DIABETIC HISTORY:  [name] was diagnosed with T2DM in 2014. She has been on oral glycemic agents since her diagnosis. She is developed a rash while on Jardiance, but after 4 month of discontinuation that rash persisted. Her hemoglobin A1c has ranged from 7.3% in 2014, peaking at > 15.5 %  In 2018.   On her initial visit to our clinic her A1c was 13.9%, she declined insulin at the time and was started on Glipizide.   SUBJECTIVE:   During the last visit (07/30/2018): A1c 13.9%. She was continued on Metformin 1000 mg BID and started Glipizide 5 mg BID.   Today (09/12/2018): [name] is here for a 6 week follow up on diabetes management. She checks her blood sugars *** times daily, preprandial to breakfast and ***. The patient has *** had hypoglycemic episodes since the last clinic visit, which typically occur *** x / - most often occuring ***. The patient is *** symptomatic with these episodes, with symptoms of {symptoms; hypoglycemia:9084048}. Otherwise, the patient {HAS/HAS NOT:522402} required any recent emergency interventions for hypoglycemia and {HAS/HAS NOT:522402} had recent hospitalizations secondary to hyper or hypoglycemic episodes.    ROS: As per HPI and as detailed below: ROS    HOME DIABETES REGIMEN:    Statin: *** ACE-I/ARB: *** Prior Diabetic Education: ***   METER DOWNLOAD SUMMARY:  Date range evaluated: *** Fingerstick Blood Glucose Tests = *** Average Number Tests/Day = *** Overall Mean FS Glucose = *** Standard Deviation = ***  BG Ranges: Low = *** High = ***   Hypoglycemic Events/30 Days: BG < 50 = *** Episodes of symptomatic severe hypoglycemia = ***    DIABETIC COMPLICATIONS: Microvascular complications:   ***  Denies:   Last Eye Exam: Completed   Macrovascular complications:   ***  Denies: CAD, CVA, PVD   HISTORY:  Past Medical History:  Past Medical History:  Diagnosis Date  . Anemia   . COPD (chronic obstructive pulmonary disease) ([place]) 2005  . GERD (gastroesophageal reflux disease)   . Hypertension   . Menometrorrhagia   . Obesity   . OSA on CPAP      Past Surgical History: No past surgical history on file.  Social History:  reports that she quit smoking about 14 years ago. Her smoking use included cigarettes. She has never used smokeless tobacco. She reports that she does not drink alcohol or use drugs. Family History:  Family History  Problem Relation Age of Onset  . Diabetes Mother   . Colon cancer Mother        Dx age 59      HOME MEDICATIONS: Allergies as of 09/12/2018      Reactions   Ace Inhibitors    Tongue swelling on Lisinopril   Jardiance [empagliflozin] Hives   Hives and skin eruptions   Metformin And Related Nausea Only      Medication List       Accurate as of September 12, 2018  7:55 AM. If you have  any questions, ask your nurse or doctor.        Accu-Chek Aviva device 1 each by Other route daily.   Ferrous Gluconate 325 (36 Fe) MG Tabs Take 1 tablet by mouth 2 (two) times daily. Take with a stool softener   glipiZIDE 5 MG tablet Commonly known as: GLUCOTROL Take 1 tablet (5 mg total) by mouth 2 (two) times daily before a meal.   glucose blood test strip Commonly known as: Accu-Chek Aviva 2x daily   losartan 100 MG tablet Commonly known as: COZAAR TAKE 1 TABLET(100 MG) BY MOUTH DAILY    metFORMIN 500 MG 24 hr tablet Commonly known as: GLUCOPHAGE-XR Take 2 tablets (1,000 mg total) by mouth 2 (two) times daily with a meal.   multivitamin tablet Take 1 tablet by mouth daily.   mupirocin ointment 2 % Commonly known as: BACTROBAN Apply 1 application topically 3 (three) times daily.   omeprazole 40 MG capsule Commonly known as: PRILOSEC Take 1 capsule (40 mg total) by mouth daily.   triamcinolone 0.025 % ointment Commonly known as: KENALOG Apply 1 application topically 2 (two) times daily.        OBJECTIVE:   Vital Signs: There were no vitals taken for this visit.  Wt Readings from Last 3 Encounters:  01/03/18 295 lb 9.6 oz (134.1 kg)  12/19/17 294 lb (133.4 kg)  09/25/17 (!) 312 lb (141.5 kg)     Exam: General: Pt appears well and is in NAD  Hydration: Well-hydrated with moist mucous membranes and good skin turgor  HEENT: Head: Unremarkable with good dentition. Oropharynx clear without exudate.  Eyes: External eye exam normal without stare, lid lag or exophthalmos.  EOM intact.  PERRL.  Neck: General: Supple without adenopathy. Thyroid: Thyroid size normal.  No goiter or nodules appreciated. No thyroid bruit.  Lungs: Clear with good BS bilat with no rales, rhonchi, or wheezes  Heart: RRR with normal S1 and S2 and no gallops; no murmurs; no rub  Abdomen: Normoactive bowel sounds, soft, nontender, without masses or organomegaly palpable  Extremities: No pretibial edema. No tremor. Normal strength and motion throughout. See detailed diabetic foot exam below.  Skin: Normal texture and temperature to palpation. No rash noted. No Acanthosis nigricans/skin tags. No lipohypertrophy.  Neuro: MS is good with appropriate affect, pt is alert and Ox3    DM foot exam: Please see diabetic assessment flow-sheet detailed below:           DATA REVIEWED:  Lab Results  Component Value Date   HGBA1C 13.9 (H) 07/18/2018   HGBA1C 11.6 (A) 01/03/2018   HGBA1C  14.0 (A) 09/25/2017   HGBA1C >14.0 09/25/2017   Lab Results  Component Value Date   MICROALBUR 6.3 08/13/2015   LDLCALC 85 07/18/2018   CREATININE 0.69 07/18/2018   No results found for: MICRALBCREAT   Lab Results  Component Value Date   CHOL 145 07/18/2018   HDL 42 07/18/2018   LDLCALC 85 07/18/2018   TRIG 91 07/18/2018   CHOLHDL 3.5 07/18/2018         ASSESSMENT / PLAN / RECOMMENDATIONS:   1) Type {NUMBERS 1 OR 2:522190} Diabetes Mellitus, ***controlled, With *** complications - Most recent A1c of *** %. Goal A1c < *** %.  ***  Plan: MEDICATIONS:  ***  EDUCATION / INSTRUCTIONS:  BG monitoring instructions: Patient is instructed to check her blood sugars *** times a day, ***.  Call [facility] if: BG persistently < 70 or > 300. Marland Kitchen  I reviewed the Rule of 15 for the treatment of hypoglycemia in detail with the patient. Literature supplied.  REFERRALS:  ***.   2) Diabetic complications:   Eye: Does *** have known diabetic retinopathy.   Neuro/ Feet: Does *** have known diabetic peripheral neuropathy .   Renal: Patient does *** have known baseline CKD. She   is *** on an ACEI/ARB at present. Check urine albumin/creatinine ratio yearly starting at time of diagnosis. If albuminuria is positive, treatment is geared toward better glucose, blood pressure control and use of ACE inhibitors or ARBs. Monitor electrolytes and creatinine once to twice yearly.   3) Lipids: Patient is *** on a statin.  4) Hypertension: *** at goal of < 140/90 mmHg.    F/U in ***    Signed electronically by: Mack Guise, MD  [facility]  [facility] [address] [place], [place] [address] Phone: [phone] FAX: [phone]   CC: Dorise Hiss, PA-C [place] [address] Phone: [phone]  Fax: [phone]  Return to Endocrinology clinic as below: Future Appointments  Date Time  Provider [place]  09/12/2018  8:10 AM Shamleffer, Melanie Crazier, MD [id number] None

## 2018-09-16 ENCOUNTER — Encounter: Payer: Self-pay | Admitting: Internal Medicine

## 2018-09-16 ENCOUNTER — Ambulatory Visit (INDEPENDENT_AMBULATORY_CARE_PROVIDER_SITE_OTHER): Payer: BC Managed Care – PPO | Admitting: Internal Medicine

## 2018-09-16 ENCOUNTER — Other Ambulatory Visit: Payer: Self-pay

## 2018-09-16 VITALS — BP 138/88 | HR 104 | Temp 98.2°F | Ht 64.0 in | Wt 332.4 lb

## 2018-09-16 DIAGNOSIS — E1165 Type 2 diabetes mellitus with hyperglycemia: Secondary | ICD-10-CM | POA: Diagnosis not present

## 2018-09-16 DIAGNOSIS — L282 Other prurigo: Secondary | ICD-10-CM

## 2018-09-16 MED ORDER — GLIPIZIDE 10 MG PO TABS: 10.0000 mg | ORAL_TABLET | Freq: Two times a day (BID) | ORAL | 3 refills | Status: DC

## 2018-09-16 NOTE — Progress Notes (Signed)
Name: Tonya Moore  Age/ Sex: 48 y.o., female   MRN/ DOB: [id number], Feb 05, 1971     PCP: Dorise Hiss, PA-C   Reason for Endocrinology Evaluation: Type 2 Diabetes Mellitus  Initial Endocrine Consultative Visit: 07/30/2018    PATIENT IDENTIFIER: Tonya Moore is a 48 y.o. female with a past medical history of HTN, T2DM, Hyperlipidemia, COPD and OSA . The patient has followed with Endocrinology clinic since 07/30/2018 for consultative assistance with management of her diabetes.  DIABETIC HISTORY:  Tonya Moore was diagnosed with T2DM in 2014. She has been on oral glycemic agents since her diagnosis. She is developed a rash while on Jardiance, but after 4 month of discontinuation that rash persisted. Her hemoglobin A1c has ranged from 7.3% in 2014, peaking at > 15.5 %  In 2018.   On her initial visit to our clinic her A1c was 13.9%, she declined insulin at the time and was started on Glipizide.   SUBJECTIVE:   During the last visit (07/30/2018): A1c 13.9%. She was continued on Metformin 1000 mg BID and started Glipizide 5 mg BID.   Today (09/16/2018): Tonya Moore is here for a 6 week follow up on diabetes management. She checks her blood sugars 2-3 times daily, preprandial and bedtime. The patient has not had hypoglycemic episodes since the last clinic visit.She has been eating 3 meals a day and snacks , she has been eating 200-300 Kcal per meal/ snack. She has noted LE swelling as well as bloating. She has SOB and is scheduled for a stress test at the end of the month. Otherwise, the patient has not required any recent emergency interventions for hypoglycemia and has not had recent hospitalizations secondary to hyper or hypoglycemic episodes.    ROS: As per HPI and as detailed below: Review of Systems  Constitutional: Negative for fever and weight loss.  HENT: Negative for congestion and sore throat.   Respiratory: Positive for cough and shortness of breath.    Cardiovascular: Negative for chest pain and palpitations.  Gastrointestinal: Positive for constipation. Negative for nausea.  Skin: Positive for itching and rash.      HOME DIABETES REGIMEN:  Metformin 500 mg XR 2 tablets BID  Glipizide 5 mg BID    METER DOWNLOAD SUMMARY: unable to download  BG range 142- 239 mg/dL      HISTORY:  Past Medical History:  Past Medical History:  Diagnosis Date  . Anemia   . COPD (chronic obstructive pulmonary disease) ([place]) 2005  . GERD (gastroesophageal reflux disease)   . Hypertension   . Menometrorrhagia   . Obesity   . OSA on CPAP     Past Surgical History: No past surgical history on file.  Social History:  reports that she quit smoking about 14 years ago. Her smoking use included cigarettes. She has never used smokeless tobacco. She reports that she does not drink alcohol or use drugs. Family History:  Family History  Problem Relation Age of Onset  . Diabetes Mother   . Colon cancer Mother        Dx age 5     HOME MEDICATIONS: Allergies as of 09/16/2018      Reactions   Ace Inhibitors    Tongue swelling on Lisinopril   Jardiance [empagliflozin] Hives   Hives and skin eruptions   Metformin And Related Nausea Only      Medication List       Accurate as of September 16, 2018  9:56  AM. If you have any questions, ask your nurse or doctor.        Accu-Chek Aviva device 1 each by Other route daily.   Ferrous Gluconate 325 (36 Fe) MG Tabs Take 1 tablet by mouth 2 (two) times daily. Take with a stool softener   glipiZIDE 10 MG tablet Commonly known as: GLUCOTROL Take 1 tablet (10 mg total) by mouth 2 (two) times daily before a meal. What changed:   medication strength  how much to take Changed by: Dorita Sciara, MD   glucose blood test strip Commonly known as: Accu-Chek Aviva 2x daily   losartan 100 MG tablet Commonly known as: COZAAR TAKE 1 TABLET(100 MG) BY MOUTH DAILY   metFORMIN 500 MG 24 hr tablet  Commonly known as: GLUCOPHAGE-XR Take 2 tablets (1,000 mg total) by mouth 2 (two) times daily with a meal.   multivitamin tablet Take 1 tablet by mouth daily.   mupirocin ointment 2 % Commonly known as: BACTROBAN Apply 1 application topically 3 (three) times daily.   omeprazole 40 MG capsule Commonly known as: PRILOSEC Take 1 capsule (40 mg total) by mouth daily.   triamcinolone 0.025 % ointment Commonly known as: KENALOG Apply 1 application topically 2 (two) times daily.        OBJECTIVE:   Vital Signs: BP 138/88 (BP Location: Left Arm, Patient Position: Sitting, Cuff Size: Large)   Pulse (!) 104   Temp 98.2 F (36.8 C)   Ht 5\' 4"  (1.626 m)   Wt (!) 332 lb 6.4 oz (150.8 kg)   SpO2 98%   BMI 57.06 kg/m   Wt Readings from Last 3 Encounters:  09/16/18 (!) 332 lb 6.4 oz (150.8 kg)  01/03/18 295 lb 9.6 oz (134.1 kg)  12/19/17 294 lb (133.4 kg)     Exam: General: Pt appears well and is in NAD  Hydration: Well-hydrated with moist mucous membranes and good skin turgor  HEENT: Head: Unremarkable with good dentition. Oropharynx clear without exudate.  Eyes: External eye exam normal without stare, lid lag or exophthalmos.  EOM intact.  PERRL.  Neck: General: Supple without adenopathy. Thyroid: Thyroid size normal.  No goiter or nodules appreciated. No thyroid bruit.  Lungs: Clear with good BS bilat with no rales, rhonchi, or wheezes  Heart: RRR with normal S1 and S2 and no gallops; no murmurs; no rub  Abdomen: Soft, non-tender with peau d' orange   Extremities: 2+ pretibial edema. No tremor. Normal strength and motion throughout. See detailed diabetic foot exam below.  Skin: Multiple erythematous papules and scabs over the face, elbows, and shins  Neuro: MS is good with appropriate affect, pt is alert and Ox3    DM foot exam: 09/16/2018    The skin of the feet is intact without sores or ulcerations. The pedal pulses are 2+ on right and 2+ on left. The sensation is  intact to a screening 5.07, 10 gram monofilament bilaterally          DATA REVIEWED:  Lab Results  Component Value Date   HGBA1C 13.9 (H) 07/18/2018   HGBA1C 11.6 (A) 01/03/2018   HGBA1C 14.0 (A) 09/25/2017   HGBA1C >14.0 09/25/2017   Lab Results  Component Value Date   MICROALBUR 6.3 08/13/2015   LDLCALC 85 07/18/2018   CREATININE 0.69 07/18/2018     Lab Results  Component Value Date   CHOL 145 07/18/2018   HDL 42 07/18/2018   LDLCALC 85 07/18/2018   TRIG 91 07/18/2018  CHOLHDL 3.5 07/18/2018         ASSESSMENT / PLAN / RECOMMENDATIONS:   1) Type 2 Diabetes Mellitus, Poorly controlled, Without complications - Most recent A1c of 13.9 %. Goal A1c < 7.0 %.    Plan:  - She is satisfied with her BG's being less then 400 mg/dL. She is concerned because of the swelling, I explained to her that SU cause weight gain but not fluid retention per se.  - She is trying to do better with diet, she ate crispy kreme once since the last time we spoke. I explained to her the importance of eating 3 meals a day and avoiding snacks. She will benefit from seeing a nutritionist.  - We did discuss that her BG's continue to be above goal.  - She had declined insulin in the past, will increase glipizide as below     MEDICATIONS: Continue Metformin 500 mg XR 2 tabs BID Increase glipizide to 10 mg BID   EDUCATION / INSTRUCTIONS:  BG monitoring instructions: Patient is instructed to check her blood sugars 2 times a day, fasting and bedtime.  Call [facility] if: BG persistently < 70 or > 300. . I reviewed the Rule of 15 for the treatment of hypoglycemia in detail with the patient. Literature supplied.    2) Diabetic complications:   Eye: Does not have known diabetic retinopathy.   Neuro/ Feet: Does not have known diabetic peripheral neuropathy .   Renal: Patient does not have known baseline CKD. She is on an ACEI/ARB at present.   3) Pruritic Rash :   - She  has not seen dermatology before. I will check her for celiac disease   4) Generalized Edema :   - Discussed low salt diet. Discussed exercise to off-set the weight gain with SU.  - Pt to address this with PCP     F/U in 2 months    Signed electronically by: Mack Guise, MD  [facility]  [address] [place], [place] [address] Phone: [phone] FAX: [phone]   CC: Dorise Hiss, PA-C [place]  [address] Phone: [phone]  Fax: [phone]  Return to Endocrinology clinic as below: Future Appointments  Date Time Provider [place]  11/15/2018  8:10 AM Yoan Sallade, Melanie Crazier, MD [facility] None

## 2018-09-16 NOTE — Patient Instructions (Signed)
-   Increase glipizide to 10 mg twice a day with breakfast and supper - Continue Metformin 2  Tablets with Breakfast and 2 tablets with suppetr  Choose healthy, lower carb lower calorie snacks: toss salad, cooked vegetables, cottage cheese, peanut butter, low fat cheese / string cheese, lower sodium deli meat, tuna salad or chicken salad     HOW TO TREAT LOW BLOOD SUGARS (Blood sugar LESS THAN 70 MG/DL)  Please follow the RULE OF 15 for the treatment of hypoglycemia treatment (when your (blood sugars are less than 70 mg/dL)    STEP 1: Take 15 grams of carbohydrates when your blood sugar is low, which includes:   3-4 GLUCOSE TABS  OR  3-4 OZ OF JUICE OR REGULAR SODA OR  ONE TUBE OF GLUCOSE GEL     STEP 2: RECHECK blood sugar in 15 MINUTES STEP 3: If your blood sugar is still low at the 15 minute recheck --> then, go back to STEP 1 and treat AGAIN with another 15 grams of carbohydrates.

## 2018-09-18 LAB — CELIAC DISEASE AB SCREEN W/RFX
Antigliadin Abs, IgA: 9 units (ref 0–19)
IgA/Immunoglobulin A, Serum: 289 mg/dL (ref 87–352)
Transglutaminase IgA: 3 U/mL (ref 0–3)

## 2018-09-24 DIAGNOSIS — K219 Gastro-esophageal reflux disease without esophagitis: Secondary | ICD-10-CM | POA: Diagnosis not present

## 2018-09-24 DIAGNOSIS — E119 Type 2 diabetes mellitus without complications: Secondary | ICD-10-CM | POA: Diagnosis not present

## 2018-09-24 DIAGNOSIS — I10 Essential (primary) hypertension: Secondary | ICD-10-CM | POA: Diagnosis not present

## 2018-09-24 DIAGNOSIS — G4733 Obstructive sleep apnea (adult) (pediatric): Secondary | ICD-10-CM | POA: Diagnosis not present

## 2018-10-18 DIAGNOSIS — R0981 Nasal congestion: Secondary | ICD-10-CM | POA: Diagnosis not present

## 2018-10-18 DIAGNOSIS — D72829 Elevated white blood cell count, unspecified: Secondary | ICD-10-CM | POA: Diagnosis not present

## 2018-10-18 DIAGNOSIS — I444 Left anterior fascicular block: Secondary | ICD-10-CM | POA: Diagnosis not present

## 2018-10-18 DIAGNOSIS — Z6841 Body Mass Index (BMI) 40.0 and over, adult: Secondary | ICD-10-CM | POA: Diagnosis not present

## 2018-10-18 DIAGNOSIS — E1165 Type 2 diabetes mellitus with hyperglycemia: Secondary | ICD-10-CM | POA: Diagnosis not present

## 2018-10-18 DIAGNOSIS — I2699 Other pulmonary embolism without acute cor pulmonale: Secondary | ICD-10-CM | POA: Diagnosis not present

## 2018-10-18 DIAGNOSIS — I509 Heart failure, unspecified: Secondary | ICD-10-CM | POA: Diagnosis not present

## 2018-10-18 DIAGNOSIS — E8809 Other disorders of plasma-protein metabolism, not elsewhere classified: Secondary | ICD-10-CM | POA: Diagnosis not present

## 2018-10-18 DIAGNOSIS — I8001 Phlebitis and thrombophlebitis of superficial vessels of right lower extremity: Secondary | ICD-10-CM | POA: Diagnosis not present

## 2018-10-18 DIAGNOSIS — E222 Syndrome of inappropriate secretion of antidiuretic hormone: Secondary | ICD-10-CM | POA: Diagnosis not present

## 2018-10-18 DIAGNOSIS — R9431 Abnormal electrocardiogram [ECG] [EKG]: Secondary | ICD-10-CM | POA: Diagnosis not present

## 2018-10-18 DIAGNOSIS — E871 Hypo-osmolality and hyponatremia: Secondary | ICD-10-CM | POA: Diagnosis not present

## 2018-10-18 DIAGNOSIS — Z20828 Contact with and (suspected) exposure to other viral communicable diseases: Secondary | ICD-10-CM | POA: Diagnosis not present

## 2018-10-18 DIAGNOSIS — Z9119 Patient's noncompliance with other medical treatment and regimen: Secondary | ICD-10-CM | POA: Diagnosis not present

## 2018-10-18 DIAGNOSIS — K219 Gastro-esophageal reflux disease without esophagitis: Secondary | ICD-10-CM | POA: Diagnosis not present

## 2018-10-18 DIAGNOSIS — I200 Unstable angina: Secondary | ICD-10-CM | POA: Diagnosis not present

## 2018-10-18 DIAGNOSIS — I2694 Multiple subsegmental pulmonary emboli without acute cor pulmonale: Secondary | ICD-10-CM | POA: Diagnosis not present

## 2018-10-18 DIAGNOSIS — I110 Hypertensive heart disease with heart failure: Secondary | ICD-10-CM | POA: Diagnosis not present

## 2018-10-18 DIAGNOSIS — Z9114 Patient's other noncompliance with medication regimen: Secondary | ICD-10-CM | POA: Diagnosis not present

## 2018-10-18 DIAGNOSIS — I5021 Acute systolic (congestive) heart failure: Secondary | ICD-10-CM | POA: Diagnosis not present

## 2018-10-18 DIAGNOSIS — E119 Type 2 diabetes mellitus without complications: Secondary | ICD-10-CM | POA: Diagnosis not present

## 2018-10-18 DIAGNOSIS — G4733 Obstructive sleep apnea (adult) (pediatric): Secondary | ICD-10-CM | POA: Diagnosis not present

## 2018-10-18 DIAGNOSIS — J449 Chronic obstructive pulmonary disease, unspecified: Secondary | ICD-10-CM | POA: Diagnosis not present

## 2018-10-18 DIAGNOSIS — D509 Iron deficiency anemia, unspecified: Secondary | ICD-10-CM | POA: Diagnosis not present

## 2018-10-18 DIAGNOSIS — I10 Essential (primary) hypertension: Secondary | ICD-10-CM | POA: Diagnosis not present

## 2018-10-18 DIAGNOSIS — R0602 Shortness of breath: Secondary | ICD-10-CM | POA: Diagnosis not present

## 2018-10-18 DIAGNOSIS — R000 Tachycardia, unspecified: Secondary | ICD-10-CM | POA: Diagnosis not present

## 2018-10-18 LAB — CBC AND DIFFERENTIAL
HCT: 35 — AB (ref 36–46)
Hemoglobin: 11.5 — AB (ref 12.0–16.0)
Platelets: 366 (ref 150–399)

## 2018-10-22 DIAGNOSIS — I110 Hypertensive heart disease with heart failure: Secondary | ICD-10-CM | POA: Diagnosis not present

## 2018-10-22 DIAGNOSIS — E1165 Type 2 diabetes mellitus with hyperglycemia: Secondary | ICD-10-CM | POA: Diagnosis not present

## 2018-10-22 DIAGNOSIS — I5022 Chronic systolic (congestive) heart failure: Secondary | ICD-10-CM | POA: Diagnosis not present

## 2018-10-31 DIAGNOSIS — I872 Venous insufficiency (chronic) (peripheral): Secondary | ICD-10-CM | POA: Diagnosis not present

## 2018-10-31 DIAGNOSIS — L0390 Cellulitis, unspecified: Secondary | ICD-10-CM | POA: Diagnosis not present

## 2018-11-05 DIAGNOSIS — G4733 Obstructive sleep apnea (adult) (pediatric): Secondary | ICD-10-CM | POA: Diagnosis not present

## 2018-11-12 ENCOUNTER — Other Ambulatory Visit: Payer: Self-pay

## 2018-11-13 DIAGNOSIS — Z1231 Encounter for screening mammogram for malignant neoplasm of breast: Secondary | ICD-10-CM | POA: Diagnosis not present

## 2018-11-13 DIAGNOSIS — Z01419 Encounter for gynecological examination (general) (routine) without abnormal findings: Secondary | ICD-10-CM | POA: Diagnosis not present

## 2018-11-13 DIAGNOSIS — Z6841 Body Mass Index (BMI) 40.0 and over, adult: Secondary | ICD-10-CM | POA: Diagnosis not present

## 2018-11-14 ENCOUNTER — Encounter: Payer: Self-pay | Admitting: Internal Medicine

## 2018-11-15 ENCOUNTER — Ambulatory Visit: Payer: BC Managed Care – PPO | Admitting: Internal Medicine

## 2018-11-26 ENCOUNTER — Ambulatory Visit: Payer: BC Managed Care – PPO | Admitting: Internal Medicine

## 2018-11-27 DIAGNOSIS — Z6841 Body Mass Index (BMI) 40.0 and over, adult: Secondary | ICD-10-CM | POA: Diagnosis not present

## 2018-11-27 DIAGNOSIS — Z7984 Long term (current) use of oral hypoglycemic drugs: Secondary | ICD-10-CM | POA: Diagnosis not present

## 2018-11-27 DIAGNOSIS — Z87891 Personal history of nicotine dependence: Secondary | ICD-10-CM | POA: Diagnosis not present

## 2018-11-27 DIAGNOSIS — I10 Essential (primary) hypertension: Secondary | ICD-10-CM | POA: Diagnosis not present

## 2018-11-27 DIAGNOSIS — E1165 Type 2 diabetes mellitus with hyperglycemia: Secondary | ICD-10-CM | POA: Diagnosis not present

## 2018-11-28 ENCOUNTER — Ambulatory Visit: Payer: BC Managed Care – PPO | Admitting: Dietician

## 2018-12-06 DIAGNOSIS — G4733 Obstructive sleep apnea (adult) (pediatric): Secondary | ICD-10-CM | POA: Diagnosis not present

## 2018-12-30 DIAGNOSIS — E1165 Type 2 diabetes mellitus with hyperglycemia: Secondary | ICD-10-CM | POA: Diagnosis not present

## 2018-12-30 DIAGNOSIS — R1032 Left lower quadrant pain: Secondary | ICD-10-CM | POA: Diagnosis not present

## 2018-12-30 DIAGNOSIS — R1900 Intra-abdominal and pelvic swelling, mass and lump, unspecified site: Secondary | ICD-10-CM | POA: Diagnosis not present

## 2018-12-30 DIAGNOSIS — Z6841 Body Mass Index (BMI) 40.0 and over, adult: Secondary | ICD-10-CM | POA: Diagnosis not present

## 2018-12-30 DIAGNOSIS — R635 Abnormal weight gain: Secondary | ICD-10-CM | POA: Diagnosis not present

## 2019-01-01 ENCOUNTER — Emergency Department (HOSPITAL_BASED_OUTPATIENT_CLINIC_OR_DEPARTMENT_OTHER): Payer: BC Managed Care – PPO

## 2019-01-01 ENCOUNTER — Encounter (HOSPITAL_BASED_OUTPATIENT_CLINIC_OR_DEPARTMENT_OTHER): Payer: Self-pay

## 2019-01-01 ENCOUNTER — Other Ambulatory Visit: Payer: Self-pay

## 2019-01-01 ENCOUNTER — Emergency Department (HOSPITAL_BASED_OUTPATIENT_CLINIC_OR_DEPARTMENT_OTHER)
Admission: EM | Admit: 2019-01-01 | Discharge: 2019-01-01 | Disposition: A | Discharge status: Home / Self Care | Payer: BC Managed Care – PPO | Attending: Emergency Medicine | Admitting: Emergency Medicine

## 2019-01-01 DIAGNOSIS — I110 Hypertensive heart disease with heart failure: Secondary | ICD-10-CM | POA: Insufficient documentation

## 2019-01-01 DIAGNOSIS — R140 Abdominal distension (gaseous): Secondary | ICD-10-CM | POA: Diagnosis not present

## 2019-01-01 DIAGNOSIS — J449 Chronic obstructive pulmonary disease, unspecified: Secondary | ICD-10-CM | POA: Diagnosis not present

## 2019-01-01 DIAGNOSIS — R600 Localized edema: Secondary | ICD-10-CM | POA: Diagnosis not present

## 2019-01-01 DIAGNOSIS — I509 Heart failure, unspecified: Secondary | ICD-10-CM | POA: Insufficient documentation

## 2019-01-01 DIAGNOSIS — Z7984 Long term (current) use of oral hypoglycemic drugs: Secondary | ICD-10-CM | POA: Insufficient documentation

## 2019-01-01 DIAGNOSIS — R0602 Shortness of breath: Secondary | ICD-10-CM | POA: Diagnosis not present

## 2019-01-01 DIAGNOSIS — Z79899 Other long term (current) drug therapy: Secondary | ICD-10-CM | POA: Insufficient documentation

## 2019-01-01 DIAGNOSIS — R05 Cough: Secondary | ICD-10-CM | POA: Diagnosis not present

## 2019-01-01 DIAGNOSIS — R1900 Intra-abdominal and pelvic swelling, mass and lump, unspecified site: Secondary | ICD-10-CM | POA: Diagnosis not present

## 2019-01-01 DIAGNOSIS — Z87891 Personal history of nicotine dependence: Secondary | ICD-10-CM | POA: Diagnosis not present

## 2019-01-01 HISTORY — DX: Type 2 diabetes mellitus without complications: E11.9

## 2019-01-01 HISTORY — DX: Other pulmonary embolism without acute cor pulmonale: I26.99

## 2019-01-01 HISTORY — DX: Heart failure, unspecified: I50.9

## 2019-01-01 LAB — CBC WITH DIFFERENTIAL/PLATELET
Abs Immature Granulocytes: 0.03 10*3/uL (ref 0.00–0.07)
Basophils Absolute: 0.1 10*3/uL (ref 0.0–0.1)
Basophils Relative: 1 %
Eosinophils Absolute: 0.1 10*3/uL (ref 0.0–0.5)
Eosinophils Relative: 1 %
HCT: 35.5 % — ABNORMAL LOW (ref 36.0–46.0)
Hemoglobin: 10.2 g/dL — ABNORMAL LOW (ref 12.0–15.0)
Immature Granulocytes: 0 %
Lymphocytes Relative: 16 %
Lymphs Abs: 1.5 10*3/uL (ref 0.7–4.0)
MCH: 20 pg — ABNORMAL LOW (ref 26.0–34.0)
MCHC: 28.7 g/dL — ABNORMAL LOW (ref 30.0–36.0)
MCV: 69.7 fL — ABNORMAL LOW (ref 80.0–100.0)
Monocytes Absolute: 0.7 10*3/uL (ref 0.1–1.0)
Monocytes Relative: 7 %
Neutro Abs: 7 10*3/uL (ref 1.7–7.7)
Neutrophils Relative %: 75 %
Platelets: 527 10*3/uL — ABNORMAL HIGH (ref 150–400)
RBC: 5.09 MIL/uL (ref 3.87–5.11)
RDW: 19.2 % — ABNORMAL HIGH (ref 11.5–15.5)
Smear Review: NORMAL
WBC: 9.4 10*3/uL (ref 4.0–10.5)
nRBC: 0 % (ref 0.0–0.2)

## 2019-01-01 LAB — COMPREHENSIVE METABOLIC PANEL
ALT: 14 U/L (ref 0–44)
AST: 20 U/L (ref 15–41)
Albumin: 3.9 g/dL (ref 3.5–5.0)
Alkaline Phosphatase: 67 U/L (ref 38–126)
Anion gap: 11 (ref 5–15)
BUN: 25 mg/dL — ABNORMAL HIGH (ref 6–20)
CO2: 22 mmol/L (ref 22–32)
Calcium: 8.8 mg/dL — ABNORMAL LOW (ref 8.9–10.3)
Chloride: 101 mmol/L (ref 98–111)
Creatinine, Ser: 0.81 mg/dL (ref 0.44–1.00)
GFR calc Af Amer: >60 mL/min (ref >60)
GFR calc non Af Amer: >60 mL/min (ref >60)
Glucose, Bld: 129 mg/dL — ABNORMAL HIGH (ref 70–99)
Potassium: 3.4 mmol/L — ABNORMAL LOW (ref 3.5–5.1)
Sodium: 134 mmol/L — ABNORMAL LOW (ref 135–145)
Total Bilirubin: 1.5 mg/dL — ABNORMAL HIGH (ref 0.3–1.2)
Total Protein: 7.2 g/dL (ref 6.5–8.1)

## 2019-01-01 LAB — URINALYSIS, ROUTINE W REFLEX MICROSCOPIC
Bilirubin Urine: NEGATIVE
Glucose, UA: NEGATIVE mg/dL
Hgb urine dipstick: NEGATIVE
Ketones, ur: NEGATIVE mg/dL
Leukocytes,Ua: NEGATIVE
Nitrite: NEGATIVE
Protein, ur: 100 mg/dL — AB
Specific Gravity, Urine: 1.025 (ref 1.005–1.030)
pH: 6 (ref 5.0–8.0)

## 2019-01-01 LAB — URINALYSIS, MICROSCOPIC (REFLEX)
RBC / HPF: NONE SEEN RBC/hpf (ref 0–5)
WBC, UA: NONE SEEN WBC/hpf (ref 0–5)

## 2019-01-01 LAB — LIPASE, BLOOD: Lipase: 38 U/L (ref 11–51)

## 2019-01-01 LAB — BRAIN NATRIURETIC PEPTIDE: B Natriuretic Peptide: 599.4 pg/mL — ABNORMAL HIGH (ref 0.0–100.0)

## 2019-01-01 MED ORDER — FUROSEMIDE 20 MG PO TABS: 20.0000 mg | ORAL_TABLET | Freq: Two times a day (BID) | ORAL | 0 refills | Status: AC

## 2019-01-01 MED ORDER — IOHEXOL 300 MG/ML  SOLN
100.0000 mL | Freq: Once | INTRAMUSCULAR | Status: AC | PRN
  Administered 2019-01-01: 16:00:00 100 mL via INTRAVENOUS

## 2019-01-01 MED ORDER — POTASSIUM CHLORIDE CRYS ER 20 MEQ PO TBCR
40.0000 meq | EXTENDED_RELEASE_TABLET | Freq: Once | ORAL | Status: AC
  Administered 2019-01-01: 17:00:00 40 meq via ORAL
  Filled 2019-01-01: qty 2

## 2019-01-01 MED ORDER — FUROSEMIDE 10 MG/ML IJ SOLN
40.0000 mg | Freq: Once | INTRAMUSCULAR | Status: AC
  Administered 2019-01-01: 17:00:00 40 mg via INTRAVENOUS
  Filled 2019-01-01: qty 4

## 2019-01-01 MED ORDER — POTASSIUM CHLORIDE ER 10 MEQ PO TBCR: 30.0000 meq | EXTENDED_RELEASE_TABLET | Freq: Every day | ORAL | 0 refills | Status: AC

## 2019-01-01 NOTE — Discharge Instructions (Addendum)
You were seen in the emergency department due to increased swelling to the abdomen/lower extremities.  We suspect that this is related to your heart failure.     Your work-up showed the following: -Anemia: This is slightly worse than prior labs, will need to be rechecked by primary care -Abdominal wall swelling/fluid: We suspect this is secondary to your heart failure. -Enlargement of your liver: Your liver function tests are normal here, however this should be continued to be monitored by your primary care provider. -Possible adrenal adenoma: This is found on your CT scan, radiology has recommended a follow-up noncontrast CT to further assess this area, this can be checked by primary care.  Given your degree of swelling we are increasing your Lasix to 20 mg twice per day.  We are also sending you home with a potassium supplement as your potassium was a bit low in the emergency department.  We would like you to follow-up with your primary care provider within 48 hours for recheck of your symptoms, labs, and physical exam.  Return to the ER for new or worsening symptoms including but not limited to worsening swelling, trouble breathing, or any other concerns.

## 2019-01-01 NOTE — ED Notes (Signed)
Pt prefers to use [employer]

## 2019-01-01 NOTE — ED Notes (Signed)
Pt reports hx of PE, dvt in July, reports similar symptoms for visit today. Pt denies CP, reports sob, bloating, denies N/V, denies dizziness.

## 2019-01-01 NOTE — ED Notes (Signed)
ED Provider at bedside. 

## 2019-01-01 NOTE — ED Provider Notes (Signed)
[facility] DEPARTMENT Provider Note   CSN: [id number] Arrival date & time: 01/01/19  1315     History   Chief Complaint Chief Complaint  Patient presents with   Bloated    HPI Tonya Moore is a 48 y.o. female with a hx of COPD, CHF, anemia, diabetes mellitus, hypertension, obesity, OSA on CPAP, & prior PE on Eliquis who presents to the ED @ request of her PCP for evaluation of progressively abdominal swelling/bloating x 2 months.  Patient states that she has had progressively worsening swelling/distention to her lower abdomen.  She states that she feels very bloated.  She has had weight gain of about 20 pounds and has been eating less than usual.  She has also progressively worsening lower extremity swelling bilaterally.  She states that she feels fatigued and weighrf down by all the swelling causing her to feel exhausted when she is up and moving around or trying to do things, but not necessarily short of breath.  She states she does not necessarily feel short of breath or just tired of carrying the extra weight.  No other alleviating or aggravating factors.  She was seen by her primary care provider for this and there was a plan for outpatient CT imaging, given symptoms have been progressing she was instructed to come to the ER for this. States abdomen is not necessarily painful. She denies fever, chills, nausea, vomiting, diarrhea, obstipation, melena, hematochezia, dysuria, chest pain, dyspnea, or syncope.  She has a chronic cough which is unchanged.  States she is about to start her menstrual cycle. States she is not sexually active and does not have concern for STD. She has not missed any doses of her Eliquis.  Denies alcohol use or significant use of Tylenol.     HPI  Past Medical History:  Diagnosis Date   Anemia    CHF (congestive heart failure) (HCC)    COPD (chronic obstructive pulmonary disease) ([place]) 2005   Diabetes mellitus without complication  (HCC)    GERD (gastroesophageal reflux disease)    Hypertension    Menometrorrhagia    Obesity    OSA on CPAP    Pulmonary emboli [facility])     Patient Active Problem List   Diagnosis Date Noted   FH: colon cancer in first degree relative <24 years old 07/30/2018   Rash, vesicular 07/30/2018   Skin-picking disorder 07/30/2018   Type 2 diabetes mellitus with hyperglycemia, without long-term current use of insulin ([place]) 07/30/2018   Hyperlipemia 07/25/2016   Esophageal reflux 08/18/2014   Anemia, iron deficiency 08/18/2014   Type 2 diabetes mellitus without complication ([place]) [id number]   Hypertension 01/01/2013   OSA (obstructive sleep apnea)    Body mass index (BMI) of 50.0 to 59.9 in adult [facility])    COPD (chronic obstructive pulmonary disease) ([place])     History reviewed. No pertinent surgical history.   OB History   No obstetric history on file.      Home Medications    Prior to Admission medications   Medication Sig Start Date End Date Taking? Authorizing Provider  apixaban (ELIQUIS) 5 MG TABS tablet Take by mouth. 10/29/18 01/27/19 Yes [provider]  furosemide (LASIX) 20 MG tablet Take by mouth. 12/20/18 01/19/19 Yes [provider]  liraglutide ([place]) 18 MG/3ML SOPN Start with 0.6 for 1 week and then may increase to 1.2 for a week and then may increase to 1.8 11/27/18  Yes [provider]  metoprolol succinate (  TOPROL-XL) 50 MG 24 hr tablet Take by mouth. 10/22/18  Yes [provider]  Blood Glucose Monitoring Suppl (ACCU-CHEK AVIVA) device 1 each by Other route daily. 07/30/18   Shamleffer, Melanie Crazier, MD  Ferrous Gluconate 325 (36 FE) MG TABS Take 1 tablet by mouth 2 (two) times daily. Take with a stool softener Patient not taking: Reported on 01/03/2018 03/23/14   Harrison Mons, PA  glipiZIDE (GLUCOTROL) 10 MG tablet Take 1 tablet (10 mg total) by mouth 2 (two) times daily before a meal. 09/16/18   Shamleffer,  Melanie Crazier, MD  glucose blood (ACCU-CHEK AVIVA) test strip 2x daily 07/30/18   Shamleffer, Melanie Crazier, MD  losartan (COZAAR) 100 MG tablet TAKE 1 TABLET(100 MG) BY MOUTH DAILY 07/16/18   Forrest Moron, MD  metFORMIN (GLUCOPHAGE-XR) 500 MG 24 hr tablet Take 2 tablets (1,000 mg total) by mouth 2 (two) times daily with a meal. 07/16/18   Forrest Moron, MD  Multiple Vitamin (MULTIVITAMIN) tablet Take 1 tablet by mouth daily.    [provider]  mupirocin ointment (BACTROBAN) 2 % Apply 1 application topically 3 (three) times daily. 07/16/18   Forrest Moron, MD  omeprazole (PRILOSEC) 40 MG capsule Take 1 capsule (40 mg total) by mouth daily. 07/16/18   Forrest Moron, MD  triamcinolone (KENALOG) 0.025 % ointment Apply 1 application topically 2 (two) times daily. Patient not taking: Reported on 07/30/2018 08/18/14   Harrison Mons, PA    Family History Family History  Problem Relation Age of Onset   Diabetes Mother    Colon cancer Mother        Dx age 59    Social History Social History   Tobacco Use   Smoking status: Former Smoker    Types: Cigarettes    Quit date: 02/23/2004    Years since quitting: 14.8   Smokeless tobacco: Never Used  Substance Use Topics   Alcohol use: No    Alcohol/week: 0.0 standard drinks   Drug use: No     Allergies   Ace inhibitors and Jardiance [empagliflozin]   Review of Systems Review of Systems  Constitutional: Positive for fatigue. Negative for chills and fever.  Respiratory: Positive for cough (chronic unchanged). Negative for shortness of breath.   Cardiovascular: Negative for chest pain.  Gastrointestinal: Positive for abdominal distention. Negative for abdominal pain, anal bleeding, blood in stool, constipation, diarrhea, nausea and vomiting.  Genitourinary: Negative for dysuria.  Neurological: Negative for dizziness and syncope.    Physical Exam Updated Vital Signs BP (!) 148/98 (BP Location: Left Arm)     Pulse 90    Temp 98.3 F (36.8 C) (Oral)    Resp 20    Ht 5\' 5"  (1.651 m)    Wt (!) 156.5 kg    SpO2 98%    BMI 57.41 kg/m   Physical Exam Vitals signs and nursing note reviewed.  Constitutional:      General: She is not in acute distress.    Appearance: She is well-developed. She is not toxic-appearing.  HENT:     Head: Normocephalic and atraumatic.  Eyes:     General:        Right eye: No discharge.        Left eye: No discharge.     Conjunctiva/sclera: Conjunctivae normal.  Neck:     Musculoskeletal: Neck supple.  Cardiovascular:     Rate and Rhythm: Normal rate and regular rhythm.  Pulmonary:     Effort: Pulmonary  effort is normal. No respiratory distress.     Breath sounds: Normal breath sounds. No wheezing, rhonchi or rales.  Abdominal:     Tenderness: There is no abdominal tenderness. There is no right CVA tenderness, left CVA tenderness, guarding or rebound.     Comments: Abdomen substantially swollen/distended/edematous more so to the lower aspect. Mild erythema to the lower abdomen as well.   Musculoskeletal:     Comments: Symmetric pitting edema noted to the lower legs.   Skin:    General: Skin is warm and dry.     Findings: No rash.  Neurological:     Mental Status: She is alert.     Comments: Clear speech.   Psychiatric:        Behavior: Behavior normal.    ED Treatments / Results  Labs (all labs ordered are listed, but only abnormal results are displayed) Labs Reviewed  URINALYSIS, ROUTINE W REFLEX MICROSCOPIC - Abnormal; Notable for the following components:      Result Value   Protein, ur 100 (*)    All other components within normal limits  URINALYSIS, MICROSCOPIC (REFLEX) - Abnormal; Notable for the following components:   Bacteria, UA FEW (*)    All other components within normal limits  CBC WITH DIFFERENTIAL/PLATELET - Abnormal; Notable for the following components:   Hemoglobin 10.2 (*)    HCT 35.5 (*)    MCV 69.7 (*)    MCH 20.0 (*)    MCHC  28.7 (*)    RDW 19.2 (*)    Platelets 527 (*)    All other components within normal limits  COMPREHENSIVE METABOLIC PANEL - Abnormal; Notable for the following components:   Sodium 134 (*)    Potassium 3.4 (*)    Glucose, Bld 129 (*)    BUN 25 (*)    Calcium 8.8 (*)    Total Bilirubin 1.5 (*)    All other components within normal limits  BRAIN NATRIURETIC PEPTIDE - Abnormal; Notable for the following components:   B Natriuretic Peptide 599.4 (*)    All other components within normal limits  LIPASE, BLOOD    EKG EKG Interpretation  Date/Time:  Wednesday January 01 2019 14:58:12 EDT Ventricular Rate:  89 PR Interval:    QRS Duration: 95 QT Interval:  380 QTC Calculation: 463 R Axis:   135 Text Interpretation:  Sinus rhythm Anterolateral infarct, old No previous ECGs available Confirmed by Theotis Burrow [phone]) on 01/01/2019 6:19:35 PM   Radiology Ct Abdomen Pelvis W Contrast  Result Date: 01/01/2019 CLINICAL DATA:  Abdominal distension, weight gain, right-sided abdominal pain and bloating for 2 weeks. EXAM: CT ABDOMEN AND PELVIS WITH CONTRAST TECHNIQUE: Multidetector CT imaging of the abdomen and pelvis was performed using the standard protocol following bolus administration of intravenous contrast. Study limited by body habitus. CONTRAST:  134mL OMNIPAQUE IOHEXOL 300 MG/ML  SOLN COMPARISON:  PE chest acquired 10/18/2018 FINDINGS: Lower chest: Small right pleural effusion. Heart is incompletely imaged. No visible pericardial effusion. Hepatobiliary: Lobular enlarged liver measures 20 cm in greatest craniocaudal dimension. No signs of biliary ductal distension. Pancreas: Unremarkable. No pancreatic ductal dilatation or surrounding inflammatory changes. Spleen: Normal in size without focal abnormality. Adrenals/Urinary Tract: Right adrenal lesion measures 2.2 x 2.0 cm and shows homogeneous density approximately 40 Hounsfield units on venous phase exam. Left adrenal is normal.  Symmetric renal enhancement without signs of hydronephrosis. Stomach/Bowel: No signs of acute gastrointestinal process. Bowel floats and ascites. Appendix is difficult to visualize, visualized portions  are normal in the right lower quadrant best seen on coronal reconstructions. No overt signs of inflammation though there is fluid throughout the pelvis tracking along right and left pericolic gutter. Density values of fluid measurements affected by noise on the current scan. Vascular/Lymphatic: Patent abdominal vasculature. In scattered periportal lymph nodes in the upper abdomen. No retroperitoneal adenopathy. No signs of mesenteric adenopathy. Scattered pelvic lymph nodes none reaching pathologic size by CT criteria Reproductive: Uterus grossly unremarkable though with very limited assessment. Other: Extensive body wall edema. Musculoskeletal: No signs of acute bone finding or destructive bone process. IMPRESSION: 1. Effusions, body wall edema and ascites may reflect volume overload in this patient with known heart failure. 2. Hepatomegaly and lobular hepatic contours raising the question of congestive hepatopathy or background liver disease. Continued correlation both laboratory and clinical may be helpful. 3. Right adrenal lesion likely adrenal adenoma though incompletely characterized on the current exam. Consider follow-up noncontrast CT for further assessment. Electronically Signed   By: Zetta Bills M.D.   On: 01/01/2019 16:50   Dg Chest Port 1 View  Result Date: 01/01/2019 CLINICAL DATA:  Short of breath and lower extremity swelling.History of COPD, CHF and pulmonary embolus EXAM: PORTABLE CHEST 1 VIEW COMPARISON:  10/18/2018 FINDINGS: The heart size is enlarged. Both lungs are clear. The visualized skeletal structures are unremarkable. IMPRESSION: No active disease. Electronically Signed   By: Kerby Moors M.D.   On: 01/01/2019 16:01    Procedures Procedures (including critical care  time)  Medications Ordered in ED Medications - No data to display   Initial Impression / Assessment and Plan / ED Course  I have reviewed the triage vital signs and the nursing notes.  Pertinent labs & imaging results that were available during my care of the patient were reviewed by me and considered in my medical decision making (see chart for details).   Patient presents to the emergency department for progressively worsening swelling to the abdomen and bilateral lower extremities with weight gain over the past 2 months.  Patient is nontoxic-appearing, no apparent distress, vitals WNL with the exception of elevated blood pressure, doubt HTN emergency.  On exam she does have edema to the abdomen into the lower extremities.  No significant tenderness or peritoneal signs.  Primary concern is fluid overload related to heart failure, possibly liver process, also considering mass/cancerous process as well.  Plan for labs, chest x-ray, and CT abdomen/pelvis.  CBC: No leukocytosis.  Anemia which is somewhat worse from prior which will need PCP recheck. CMP: Mild hypokalemia at 3.4.  Mild hyponatremia at 134.  Mild hypocalcemia at 8.8.  BUN somewhat elevated, creatinine WNL.  LFTs WNL, total bili minimally elevated. Lipase: WNL Urinalysis: No UTI BNP: Elevated at 599.4. CXR: No active disease EKG: No STEMI CT A/P:  1. Effusions, body wall edema and ascites may reflect volume overload in this patient with known heart failure. 2. Hepatomegaly and lobular hepatic contours raising the question of congestive hepatopathy or background liver disease. Continued correlation both laboratory and clinical may be helpful. 3. Right adrenal lesion likely adrenal adenoma though incompletely characterized on the current exam. Consider follow-up noncontrast CT for further assessment  Suspect volume overload related to her CHF. She is currently on minimal Lasix 20 mg daily, she does not appear short of breath, no  respiratory distress or fluid overload on CXR, does not appear to require admission currently. Will give IV Lasix in the ER and increase outpatient Lasix dose to 20 mg  BID with potassium supplement with very close PCP follow up & strict ED return precautions per discussion w/ supervising physician Dr. Rex Kras. I discussed results, treatment plan, need for follow-up, and return precautions with the patient. Provided opportunity for questions, patient confirmed understanding and is in agreement with plan.   Findings and plan of care discussed with supervising physician Dr. Rex Kras who is in agreement.    Final Clinical Impressions(s) / ED Diagnoses   Final diagnoses:  Edema of abdominal wall    ED Discharge Orders         Ordered    furosemide (LASIX) 20 MG tablet  2 times daily     01/01/19 1828    potassium chloride (KLOR-CON) 10 MEQ tablet  Daily     01/01/19 1828           Amaryllis Dyke, PA-C 01/01/19 1828    Little, Wenda Overland, MD 01/02/19 0002

## 2019-01-01 NOTE — ED Triage Notes (Signed)
Pt c/o weight gain, right side side abd pain, bloating x 2 weeks-states she was advised by PCP to come to ED-NAD-steady gait

## 2019-01-03 DIAGNOSIS — I5022 Chronic systolic (congestive) heart failure: Secondary | ICD-10-CM | POA: Diagnosis not present

## 2019-01-03 DIAGNOSIS — I421 Obstructive hypertrophic cardiomyopathy: Secondary | ICD-10-CM | POA: Diagnosis not present

## 2019-01-05 DIAGNOSIS — G4733 Obstructive sleep apnea (adult) (pediatric): Secondary | ICD-10-CM | POA: Diagnosis not present

## 2019-01-14 DIAGNOSIS — I5023 Acute on chronic systolic (congestive) heart failure: Secondary | ICD-10-CM | POA: Diagnosis not present

## 2019-01-14 DIAGNOSIS — I5022 Chronic systolic (congestive) heart failure: Secondary | ICD-10-CM | POA: Diagnosis not present

## 2019-01-14 DIAGNOSIS — I110 Hypertensive heart disease with heart failure: Secondary | ICD-10-CM | POA: Diagnosis not present

## 2019-01-14 DIAGNOSIS — I2694 Multiple subsegmental pulmonary emboli without acute cor pulmonale: Secondary | ICD-10-CM | POA: Diagnosis not present

## 2019-01-14 DIAGNOSIS — I421 Obstructive hypertrophic cardiomyopathy: Secondary | ICD-10-CM | POA: Diagnosis not present

## 2019-01-17 ENCOUNTER — Other Ambulatory Visit: Payer: Self-pay | Admitting: Physician Assistant

## 2019-01-17 DIAGNOSIS — I10 Essential (primary) hypertension: Secondary | ICD-10-CM

## 2019-02-05 DIAGNOSIS — G4733 Obstructive sleep apnea (adult) (pediatric): Secondary | ICD-10-CM | POA: Diagnosis not present

## 2019-02-17 DIAGNOSIS — I421 Obstructive hypertrophic cardiomyopathy: Secondary | ICD-10-CM | POA: Diagnosis not present

## 2019-02-17 DIAGNOSIS — G4733 Obstructive sleep apnea (adult) (pediatric): Secondary | ICD-10-CM | POA: Diagnosis not present

## 2019-02-17 DIAGNOSIS — I5022 Chronic systolic (congestive) heart failure: Secondary | ICD-10-CM | POA: Diagnosis not present

## 2019-02-17 DIAGNOSIS — I2694 Multiple subsegmental pulmonary emboli without acute cor pulmonale: Secondary | ICD-10-CM | POA: Diagnosis not present

## 2019-02-17 DIAGNOSIS — I110 Hypertensive heart disease with heart failure: Secondary | ICD-10-CM | POA: Diagnosis not present

## 2019-03-10 DIAGNOSIS — I5022 Chronic systolic (congestive) heart failure: Secondary | ICD-10-CM | POA: Diagnosis not present

## 2019-03-10 DIAGNOSIS — I421 Obstructive hypertrophic cardiomyopathy: Secondary | ICD-10-CM | POA: Diagnosis not present

## 2019-03-17 ENCOUNTER — Other Ambulatory Visit: Payer: Self-pay | Admitting: Internal Medicine

## 2019-03-17 DIAGNOSIS — J069 Acute upper respiratory infection, unspecified: Secondary | ICD-10-CM | POA: Diagnosis not present

## 2019-03-17 DIAGNOSIS — U071 COVID-19: Secondary | ICD-10-CM | POA: Diagnosis not present

## 2019-03-17 DIAGNOSIS — R0989 Other specified symptoms and signs involving the circulatory and respiratory systems: Secondary | ICD-10-CM | POA: Diagnosis not present

## 2019-03-17 DIAGNOSIS — E1165 Type 2 diabetes mellitus with hyperglycemia: Secondary | ICD-10-CM

## 2019-04-19 ENCOUNTER — Other Ambulatory Visit: Payer: Self-pay | Admitting: Internal Medicine

## 2019-04-19 DIAGNOSIS — E1165 Type 2 diabetes mellitus with hyperglycemia: Secondary | ICD-10-CM

## 2019-06-12 ENCOUNTER — Other Ambulatory Visit: Payer: Self-pay | Admitting: Family Medicine

## 2019-06-12 DIAGNOSIS — E1165 Type 2 diabetes mellitus with hyperglycemia: Secondary | ICD-10-CM

## 2019-06-12 NOTE — Telephone Encounter (Signed)
  Requested  medications are  due for refill today yes  Requested medications are on the active medication list yes  Last refill 12/4  Future visit scheduled no  Notes to clinic did not meet visit range protocol

## 2019-07-17 ENCOUNTER — Ambulatory Visit: Payer: BC Managed Care – PPO | Attending: Family

## 2019-07-17 DIAGNOSIS — Z23 Encounter for immunization: Secondary | ICD-10-CM

## 2019-07-17 NOTE — Progress Notes (Signed)
   Covid-19 Vaccination Clinic  Name:  Tonya Moore    MRN: [id number] DOB: 1971/01/31  07/17/2019  [name] was observed post Covid-19 immunization for 15 minutes without incident. She was provided with Vaccine Information Sheet and instruction to access the V-Safe system.   [name] was instructed to call 911 with any severe reactions post vaccine: Marland Kitchen Difficulty breathing  . Swelling of face and throat  . A fast heartbeat  . A bad rash all over body  . Dizziness and weakness   Immunizations Administered    Name Date Dose VIS Date Route   Moderna COVID-19 Vaccine 07/17/2019 10:13 AM 0.5 mL 03/04/2019 Intramuscular   Manufacturer: Moderna   Lot: [id number]   [id number]

## 2019-08-18 ENCOUNTER — Other Ambulatory Visit: Payer: Self-pay | Admitting: Family Medicine

## 2019-08-18 DIAGNOSIS — K219 Gastro-esophageal reflux disease without esophagitis: Secondary | ICD-10-CM

## 2019-08-19 ENCOUNTER — Ambulatory Visit: Payer: BC Managed Care – PPO | Attending: Family

## 2019-08-19 DIAGNOSIS — Z23 Encounter for immunization: Secondary | ICD-10-CM

## 2019-08-19 NOTE — Progress Notes (Signed)
   Covid-19 Vaccination Clinic  Name:  Tonya Moore    MRN: [id number] DOB: 1970-08-05  08/19/2019  [name] was observed post Covid-19 immunization for 15 minutes without incident. She was provided with Vaccine Information Sheet and instruction to access the V-Safe system.   [name] was instructed to call 911 with any severe reactions post vaccine: Marland Kitchen Difficulty breathing  . Swelling of face and throat  . A fast heartbeat  . A bad rash all over body  . Dizziness and weakness   Immunizations Administered    Name Date Dose VIS Date Route   Moderna COVID-19 Vaccine 08/19/2019  4:09 PM 0.5 mL 03/2019 Intramuscular   Manufacturer: Moderna   Lot: [id number]   [id number]

## 2020-01-29 ENCOUNTER — Other Ambulatory Visit: Payer: Self-pay | Admitting: Internal Medicine

## 2020-03-24 IMAGING — CT CT ABD-PELV W/ CM
2 of 5 series · 16 of 46 positions shown, 18 images · IV contrast (omnipaque)
Comparison: PE chest acquired 10/18/2018

CLINICAL DATA: Abdominal distension, weight gain, right-sided
abdominal pain and bloating for 2 weeks.

EXAM:
CT ABDOMEN AND PELVIS WITH CONTRAST
TECHNIQUE: Multidetector CT imaging of the abdomen and pelvis was performed
using the standard protocol following bolus administration of
intravenous contrast. Study limited by body habitus.
CONTRAST:  100mL OMNIPAQUE IOHEXOL 300 MG/ML  SOLN

[Series 2: axial st · axial · 0.98mm/px · z∈[-442,-12]mm · 13 of 98 slices shown, 15 images]
[im 6/98  soft-tissue]
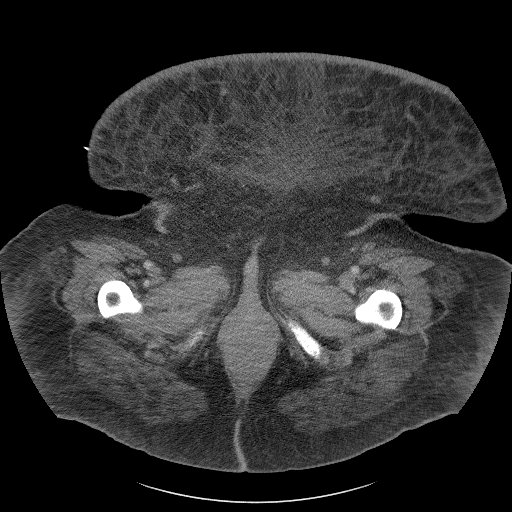
[im 6/98  bone]
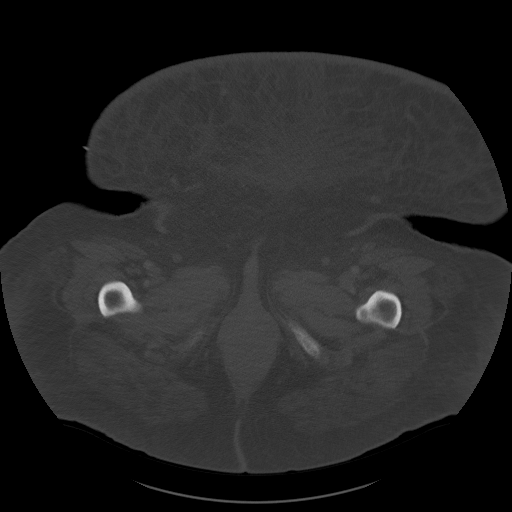
[im 16/98  soft-tissue]
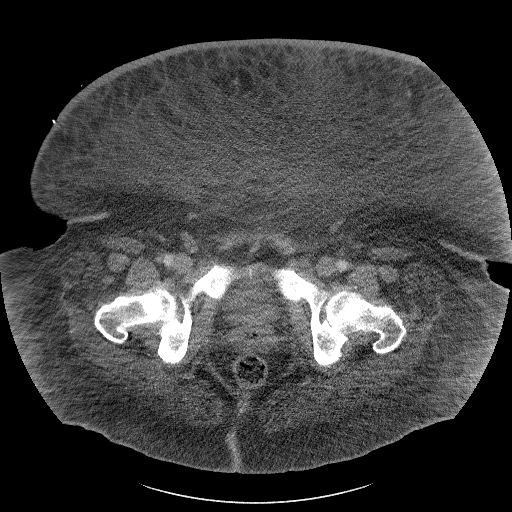
[im 21/98  soft-tissue]
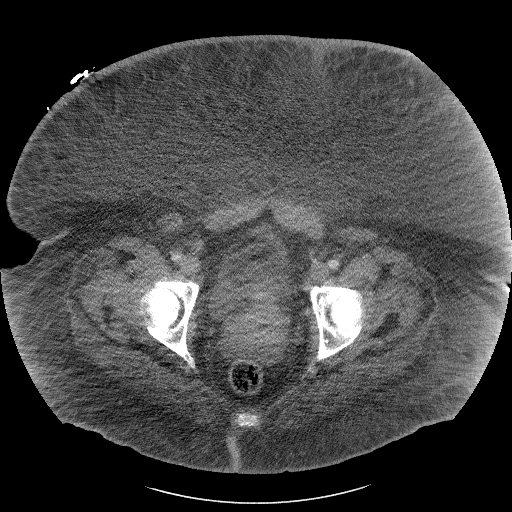
[im 26/98  soft-tissue]
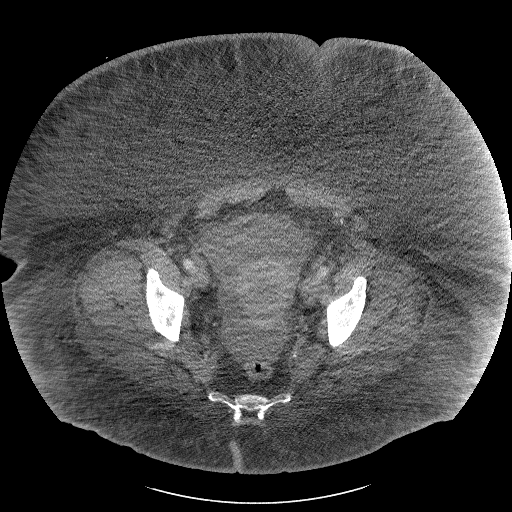
[im 36/98  soft-tissue]
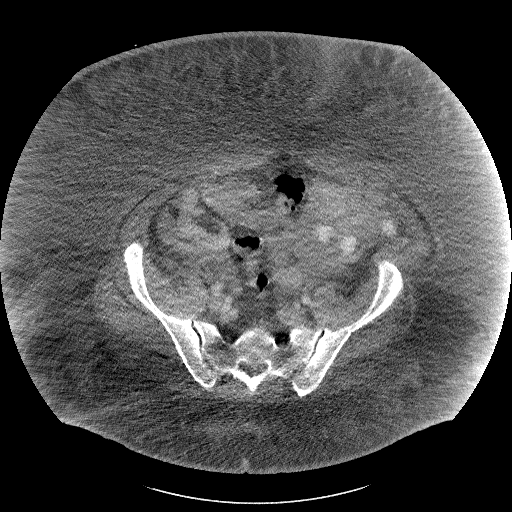
[im 41/98  soft-tissue]
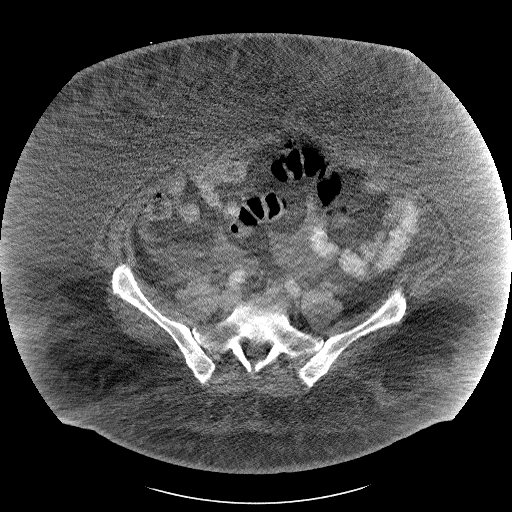
[im 52/98  soft-tissue]
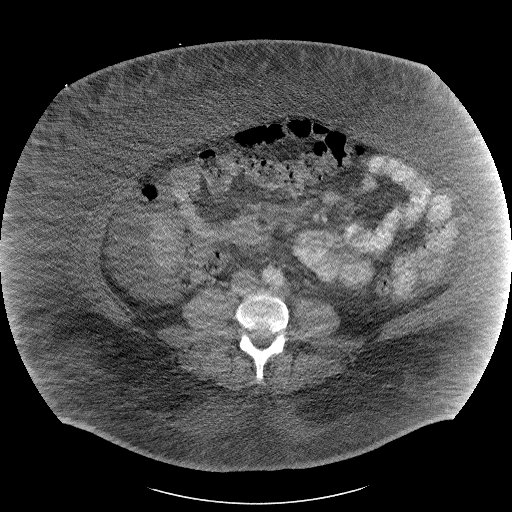
[im 57/98  soft-tissue]
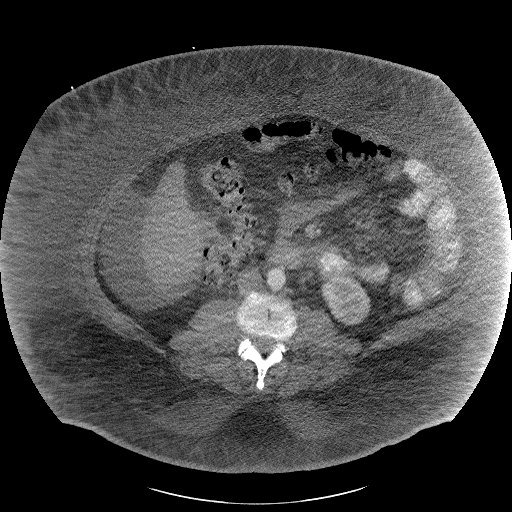
[im 62/98  soft-tissue]
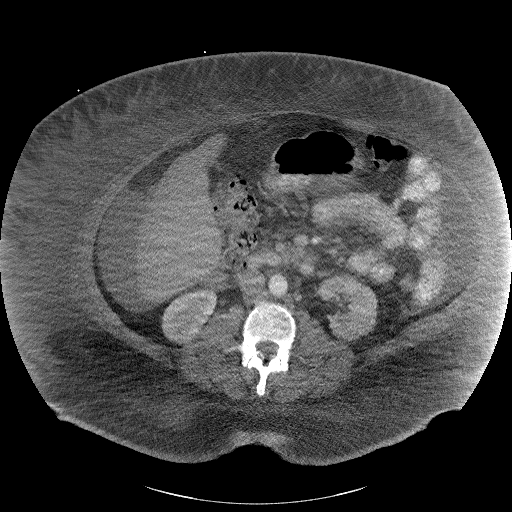
[im 62/98  bone]
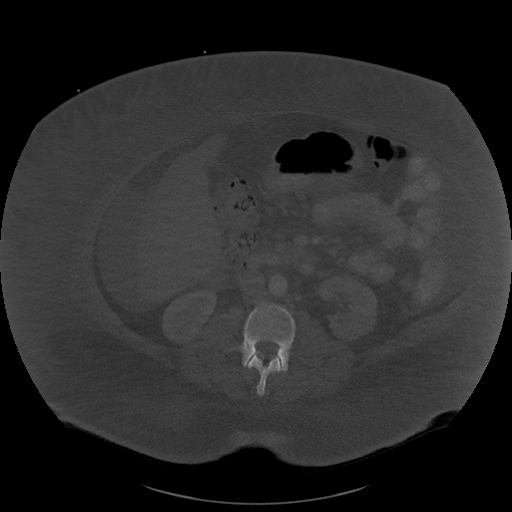
[im 72/98  soft-tissue]
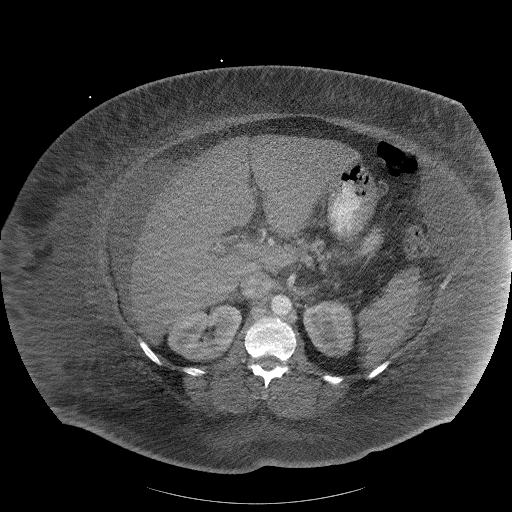
[im 77/98  soft-tissue]
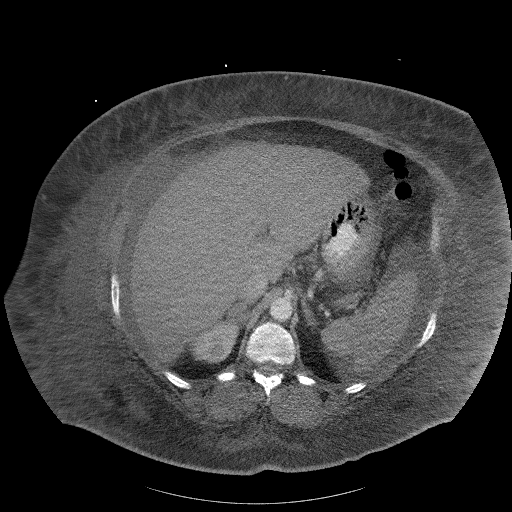
[im 82/98  soft-tissue]
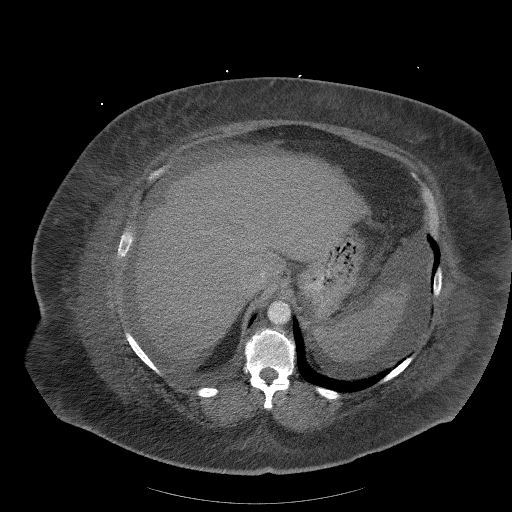
[im 92/98  soft-tissue]
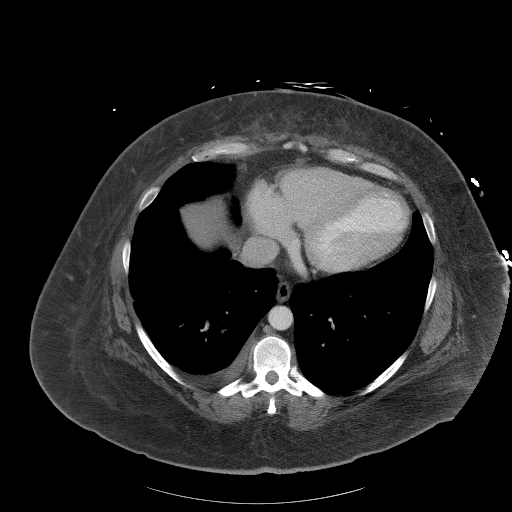

[Series 5: coronal st · coronal · 0.85mm/px · 3 of 120 slices shown]
[im 40/120  soft-tissue]
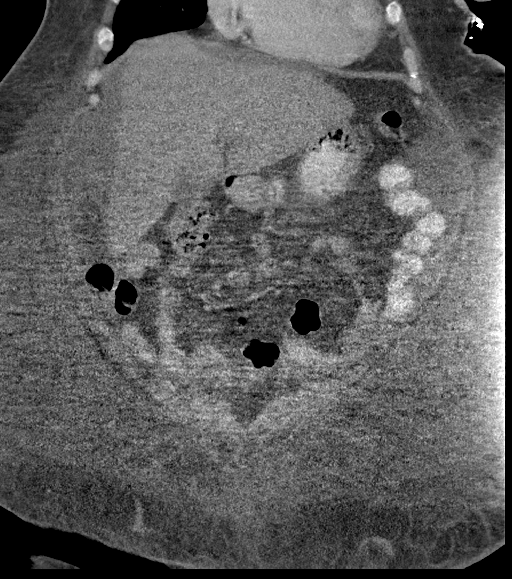
[im 53/120  soft-tissue]
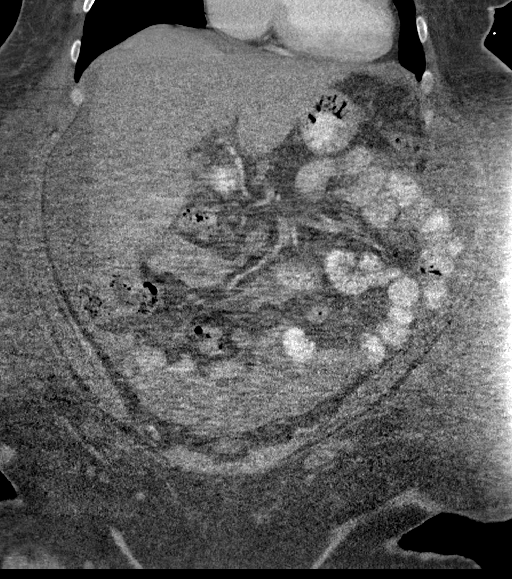
[im 67/120  soft-tissue]
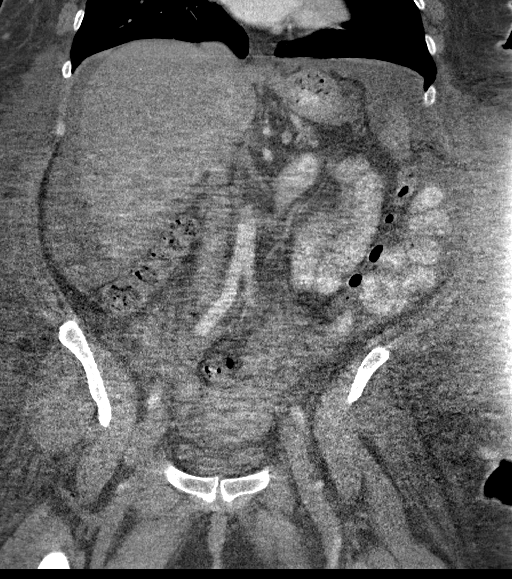

[16 of 46 positions shown; findings below may reference images not displayed]

FINDINGS: Lower chest: Small right pleural effusion. Heart is incompletely
imaged. No visible pericardial effusion.

Hepatobiliary: Lobular enlarged liver measures 20 cm in greatest
craniocaudal dimension. No signs of biliary ductal distension.

Pancreas: Unremarkable. No pancreatic ductal dilatation or
surrounding inflammatory changes.

Spleen: Normal in size without focal abnormality.

Adrenals/Urinary Tract: Right adrenal lesion measures 2.2 x 2.0 cm
and shows homogeneous density approximately 40 Hounsfield units on
venous phase exam.

Left adrenal is normal.

Symmetric renal enhancement without signs of hydronephrosis.

Stomach/Bowel: No signs of acute gastrointestinal process. Bowel
floats and ascites. Appendix is difficult to visualize, visualized
portions are normal in the right lower quadrant best seen on coronal
reconstructions. No overt signs of inflammation though there is
fluid throughout the pelvis tracking along right and left pericolic
gutter. Density values of fluid measurements affected by noise on
the current scan.

Vascular/Lymphatic: Patent abdominal vasculature.

In scattered periportal lymph nodes in the upper abdomen. No
retroperitoneal adenopathy. No signs of mesenteric adenopathy.

Scattered pelvic lymph nodes none reaching pathologic size by CT
criteria

Reproductive: Uterus grossly unremarkable though with very limited
assessment.

Other: Extensive body wall edema.

Musculoskeletal: No signs of acute bone finding or destructive bone
process.
IMPRESSION: 1. Effusions, body wall edema and ascites may reflect volume
overload in this patient with known heart failure.
2. Hepatomegaly and lobular hepatic contours raising the question of
congestive hepatopathy or background liver disease. Continued
correlation both laboratory and clinical may be helpful.
3. Right adrenal lesion likely adrenal adenoma though incompletely
characterized on the current exam. Consider follow-up noncontrast CT
for further assessment.

## 2020-03-24 IMAGING — DX DG CHEST 1V PORT
1 series · 1 of 1 positions shown · non-contrast
Comparison: 10/18/2018

CLINICAL DATA: Short of breath and lower extremity swelling.History
of COPD, CHF and pulmonary embolus

EXAM:
PORTABLE CHEST 1 VIEW

[chest ap]
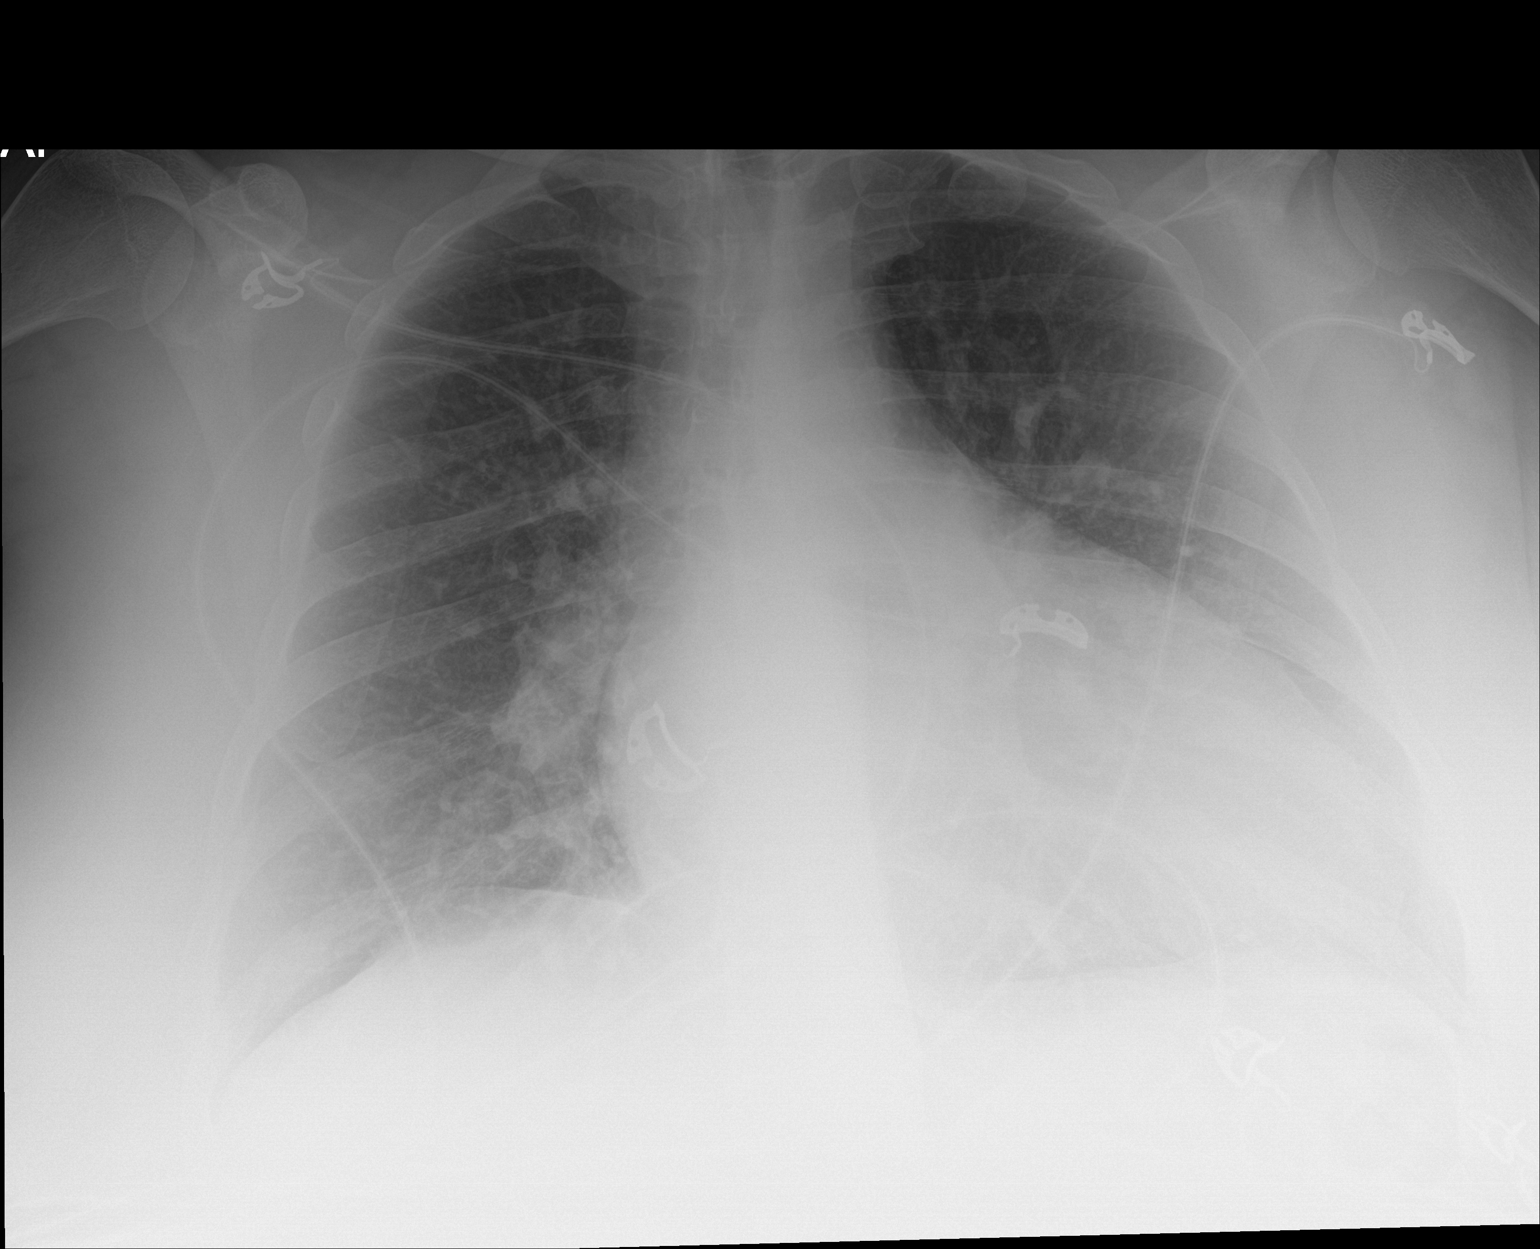

[1 of 1 positions shown; findings below may reference images not displayed]

FINDINGS: The heart size is enlarged. Both lungs are clear. The visualized
skeletal structures are unremarkable.
IMPRESSION: No active disease.

## 2023-01-05 ENCOUNTER — Inpatient Hospital Stay (HOSPITAL_COMMUNITY): Admission: RE | Admit: 2023-01-05 | Payer: BC Managed Care – PPO | Source: Ambulatory Visit

## 2023-03-05 ENCOUNTER — Ambulatory Visit (HOSPITAL_COMMUNITY)
Admission: EM | Admit: 2023-03-05 | Discharge: 2023-03-05 | Disposition: A | Discharge status: Home / Self Care | Payer: BC Managed Care – PPO | Attending: Emergency Medicine | Admitting: Emergency Medicine

## 2023-03-05 ENCOUNTER — Encounter (HOSPITAL_COMMUNITY): Payer: Self-pay | Admitting: Emergency Medicine

## 2023-03-05 DIAGNOSIS — L03116 Cellulitis of left lower limb: Secondary | ICD-10-CM | POA: Diagnosis not present

## 2023-03-05 DIAGNOSIS — E118 Type 2 diabetes mellitus with unspecified complications: Secondary | ICD-10-CM | POA: Diagnosis not present

## 2023-03-05 DIAGNOSIS — I10 Essential (primary) hypertension: Secondary | ICD-10-CM | POA: Diagnosis not present

## 2023-03-05 LAB — CBC
HCT: 48.6 % — ABNORMAL HIGH (ref 36.0–46.0)
Hemoglobin: 15.8 g/dL — ABNORMAL HIGH (ref 12.0–15.0)
MCH: 25.8 pg — ABNORMAL LOW (ref 26.0–34.0)
MCHC: 32.5 g/dL (ref 30.0–36.0)
MCV: 79.4 fL — ABNORMAL LOW (ref 80.0–100.0)
Platelets: 326 10*3/uL (ref 150–400)
RBC: 6.12 MIL/uL — ABNORMAL HIGH (ref 3.87–5.11)
RDW: 14.1 % (ref 11.5–15.5)
WBC: 8.4 10*3/uL (ref 4.0–10.5)
nRBC: 0 % (ref 0.0–0.2)

## 2023-03-05 LAB — COMPREHENSIVE METABOLIC PANEL
ALT: 15 U/L (ref 0–44)
AST: 23 U/L (ref 15–41)
Albumin: 3.8 g/dL (ref 3.5–5.0)
Alkaline Phosphatase: 103 U/L (ref 38–126)
Anion gap: 13 (ref 5–15)
BUN: 11 mg/dL (ref 6–20)
CO2: 27 mmol/L (ref 22–32)
Calcium: 9.6 mg/dL (ref 8.9–10.3)
Chloride: 95 mmol/L — ABNORMAL LOW (ref 98–111)
Creatinine, Ser: 0.77 mg/dL (ref 0.44–1.00)
GFR, Estimated: >60 mL/min (ref >60)
Glucose, Bld: 366 mg/dL — ABNORMAL HIGH (ref 70–99)
Potassium: 4.4 mmol/L (ref 3.5–5.1)
Sodium: 135 mmol/L (ref 135–145)
Total Bilirubin: 1.5 mg/dL — ABNORMAL HIGH (ref <1.2)
Total Protein: 7.2 g/dL (ref 6.5–8.1)

## 2023-03-05 LAB — HEMOGLOBIN A1C
Hgb A1c MFr Bld: 14.7 % — ABNORMAL HIGH (ref 4.8–5.6)
Mean Plasma Glucose: 375.19 mg/dL

## 2023-03-05 MED ORDER — CEPHALEXIN 500 MG PO CAPS
500.0000 mg | ORAL_CAPSULE | Freq: Four times a day (QID) | ORAL | 0 refills | Status: AC
Start: 2023-03-05 — End: ?

## 2023-03-05 NOTE — Discharge Instructions (Addendum)
Keflex for wound infection Results of blood work can be found on My Chart.  We will call for abnormal results Consider f/u with PCP for Diabetes and Hypertension management F/u with PCP for lower leg wound

## 2023-03-05 NOTE — ED Provider Notes (Signed)
[facility]    CSN: [id number] Arrival date & time: 03/05/23  1638      History   Chief Complaint Chief Complaint  Patient presents with   Wound Check    HPI Tonya Moore is a 52 y.o. female.   Patient is presenting for evaluation of lower left leg nonhealing wound.  Patient is unsure how wound started, she has been cleaning it daily with soap, applying topical mineral oil daily.  She reports that she woke up with her leg throbbing this morning.  Patient is diabetic with history of CHF and hypertension.  Patient has stopped all medications at this time.   The history is provided by the patient.  Wound Check This is a new problem. The current episode started more than 1 week ago.    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 No pertinent surgical history.  OB History   No obstetric history on file.      Home Medications    Prior to Admission medications   Medication Sig Start Date End Date Taking? Authorizing Provider  cephALEXin  (KEFLEX) 500 MG capsule Take 1 capsule (500 mg total) by mouth 4 (four) times daily. 03/05/23  Yes Rajesh Wyss, Linde Gillis, NP  apixaban (ELIQUIS) 5 MG TABS tablet Take by mouth. Patient not taking: Reported on 03/05/2023 10/29/18 01/27/19  [provider]  Blood Glucose Monitoring Suppl (ACCU-CHEK AVIVA) device 1 each by Other route daily. Patient not taking: Reported on 03/05/2023 07/30/18   Shamleffer, Konrad Dolores, MD  furosemide (LASIX) 20 MG tablet Take 1 tablet (20 mg total) by mouth 2 (two) times daily. Patient not taking: Reported on 03/05/2023 01/01/19   Petrucelli, Samantha R, PA-C  glipiZIDE (GLUCOTROL) 10 MG tablet TAKE 1 TABLET(10 MG) BY MOUTH TWICE DAILY BEFORE A MEAL Patient not taking: Reported on 03/05/2023 04/21/19   Shamleffer, Konrad Dolores, MD  glucose blood (ACCU-CHEK AVIVA) test strip 2x daily Patient not taking: Reported on 03/05/2023 07/30/18   Shamleffer, Konrad Dolores, MD  liraglutide (VICTOZA) 18 MG/3ML SOPN Start with 0.6 for 1 week and then may increase to 1.2 for a week and then may increase to 1.8 Patient not taking: Reported on 03/05/2023 11/27/18   [provider]  losartan (COZAAR) 100 MG tablet TAKE 1 TABLET(100 MG) BY MOUTH DAILY Patient not taking: Reported on 03/05/2023 07/16/18   Doristine Bosworth, MD  metFORMIN (GLUCOPHAGE-XR) 500 MG 24 hr tablet Take 2 tablets (1,000 mg total) by mouth  2 (two) times daily with a meal. Patient not taking: Reported on 03/05/2023 07/16/18   Doristine Bosworth, MD  metoprolol succinate (TOPROL-XL) 50 MG 24 hr tablet Take by mouth. Patient not taking: Reported on 03/05/2023 10/22/18   [provider]  Multiple Vitamin (MULTIVITAMIN) tablet Take 1 tablet by mouth daily. Patient not taking: Reported on 03/05/2023    [provider]  mupirocin ointment (BACTROBAN) 2 % Apply 1 application topically 3 (three) times daily. Patient not taking: Reported on 03/05/2023 07/16/18   Doristine Bosworth, MD  omeprazole  (PRILOSEC) 40 MG capsule Take 1 capsule (40 mg total) by mouth daily. Patient not taking: Reported on 03/05/2023 07/16/18   Doristine Bosworth, MD  potassium chloride (KLOR-CON) 10 MEQ tablet Take 3 tablets (30 mEq total) by mouth daily. Patient not taking: Reported on 03/05/2023 01/01/19   Petrucelli, Lelon Mast R, PA-C  triamcinolone (KENALOG) 0.025 % ointment Apply 1 application topically 2 (two) times daily. Patient not taking: Reported on 07/30/2018 08/18/14   Porfirio Oar, PA  Ferrous Gluconate 325 (36 FE) MG TABS Take 1 tablet by mouth 2 (two) times daily. Take with a stool softener Patient not taking: Reported on 01/03/2018 03/23/14 01/01/19  Porfirio Oar, PA    Family History Family History  Problem Relation Age of Onset   Diabetes Mother    Colon cancer Mother        Dx age 64    Social History Social History   Tobacco Use   Smoking status: Former    Current packs/day: 0.00    Types: Cigarettes    Quit date: 02/23/2004    Years since quitting: 19.0   Smokeless tobacco: Never  Vaping Use   Vaping status: Never Used  Substance Use Topics   Alcohol use: No    Alcohol/week: 0.0 standard drinks of alcohol   Drug use: No     Allergies   Ace inhibitors and Jardiance [empagliflozin]   Review of Systems Review of Systems  Skin:  Positive for wound.  All other systems reviewed and are negative.    Physical Exam Triage Vital Signs ED Triage Vitals  Encounter Vitals Group     BP 03/05/23 1727 (!) 191/115     Systolic BP Percentile --      Diastolic BP Percentile --      Pulse Rate 03/05/23 1727 79     Resp 03/05/23 1727 16     Temp 03/05/23 1727 98.1 F (36.7 C)     Temp Source 03/05/23 1727 Oral     SpO2 03/05/23 1727 97 %     Weight 03/05/23 1727 258 lb (117 kg)     Height 03/05/23 1727 5\' 5"  (1.651 m)     Head Circumference --      Peak Flow --      Pain Score 03/05/23 1726 5     Pain Loc --      Pain Education --      Exclude from Growth Chart --     No data found.  Updated Vital Signs BP (!) 191/115 (BP Location: Right Arm)   Pulse 79   Temp 98.1 F (36.7 C) (Oral)   Resp 16   Ht 5\' 5"  (1.651 m)   Wt 258 lb (117 kg)   LMP 01/16/2023 (Approximate)   SpO2 97%   BMI 42.93 kg/m   Visual Acuity Right Eye Distance:   Left Eye Distance:   Bilateral Distance:    Right Eye Near:  Left Eye Near:    Bilateral Near:     Physical Exam Vitals and nursing note reviewed.  Skin:    Findings: Erythema and lesion present.     Comments: Left anterior lower leg with a nonhealing wound, black center, red erythema circumference of approximately 4 inches.  Warm to touch.  Neurological:     General: No focal deficit present.     Mental Status: She is alert and oriented to person, place, and time.      UC Treatments / Results  Labs (all labs ordered are listed, but only abnormal results are displayed) Labs Reviewed  CBC - Abnormal; Notable for the following components:      Result Value   RBC 6.12 (*)    Hemoglobin 15.8 (*)    HCT 48.6 (*)    MCV 79.4 (*)    MCH 25.8 (*)    All other components within normal limits  COMPREHENSIVE METABOLIC PANEL - Abnormal; Notable for the following components:   Chloride 95 (*)    Glucose, Bld 366 (*)    Total Bilirubin 1.5 (*)    All other components within normal limits  HEMOGLOBIN A1C - Abnormal; Notable for the following components:   Hgb A1c MFr Bld 14.7 (*)    All other components within normal limits    EKG   Radiology No results found.  Procedures Procedures (including critical care time)  Medications Ordered in UC Medications - No data to display  Initial Impression / Assessment and Plan / UC Course  I have reviewed the triage vital signs and the nursing notes.  Pertinent labs & imaging results that were available during my care of the patient were reviewed by me and considered in my medical decision making (see chart for details).  Wound noted on left lower leg,  anterior.  Patient is a diabetic with no medications at this time.  Patient reports that she has stopped all blood pressure medicines, cardiac medications, COPD medications. Due to "Quality of life" We will order blood work for evaluation CBC, CMP, A1c as her non healing wound may be related to uncontrolled diabetes.  We will order Keflex for cellulitis.  Recommend follow-up with PCP for continuation of medications for diabetes and hypertension.  Education was given as to the risk of not having controlled blood pressure along with uncontrolled diabetes. Patient was agreeable Final Clinical Impressions(s) / UC Diagnoses   Final diagnoses:  Cellulitis of left lower leg  DM type 2 causing complication [facility])  Essential hypertension     Discharge Instructions      Keflex for wound infection Results of blood work can be found on My Chart.  We will call for abnormal results Consider f/u with PCP for Diabetes and Hypertension management F/u with PCP for lower leg wound    ED Prescriptions     Medication Sig Dispense Auth. Provider   cephALEXin (KEFLEX) 500 MG capsule Take 1 capsule (500 mg total) by mouth 4 (four) times daily. 20 capsule Eriq Hufford, Linde Gillis, NP      PDMP not reviewed this encounter.   Nelda Marseille, NP 03/17/23 1012

## 2023-03-05 NOTE — ED Triage Notes (Signed)
Patient here today to have a wound checked that occurred on 02/23/2023. Patient states that the wound is not healing and has become more inflamed. Patient states that she came off of all her medications per her preference and has been off of them for 2 years now. Patient is diabetic and has h/o left leg DVT. Patient was on Eliquis previously. Patient also has HTN. She has not checked her glucose since October.

## 2023-12-11 NOTE — Progress Notes (Signed)
 [address] - [facility] - [facility] [address] [place] [address]  YULIETH CARRENDER DOB: [birth year] Encounter Date: 12/11/2023   ASSESSMENT and PLAN:   Sherria was seen today for follow-up.  Diagnoses and all orders for this visit:  Localized edema -     US  Peripheral Venous Leg Unilat Left; Future  Pain of left lower extremity -     US  Peripheral Venous Leg Unilat Left; Future  Cellulitis of left lower extremity -     cephALEXin  (KEFLEX ) 500 mg capsule; Take 1 capsule (500 mg total) by mouth 3 (three) times a day for 7 days.  Uncontrolled diabetes mellitus with hyperglycemia, without long-term current use of insulin    (CMD)  Primary hypertension     Discussed the prescription noted above, including potential side effects, drug interactions, instructions for taking the medication, and the consequences of not taking it.  Patient verbalized an understanding of these instructions and had no further questions.    Chief Complaint  Patient presents with  . Follow-up    Lt leg    No follow-ups on file.  SUBJECTIVE:   Ms. Gagen is a 53 y.o. female that presents to clinic today regarding the following issues:  Pt in today - f/u leg pain. She states her Lt leg is very painful- the pain feels deeper now. She is having left calf pain. She states has less swelling since taking fluid pills. She has some redness to lower leg as well. Prior wounds appear to have healed.  She has not picked up her prescription for diabetes or blood pressure medication. She states she has financial problems and was waiting to get paid to get medication. She is mostly concerned about her leg. She does have remote history of DVT and was treated with eliquis and had no recurrence. She was advised when she sent message last week that I recommended she be evaluated for possible DVT but she states did not want to be seen or have testing done to possible cost of same. She just  wants antibiotic oral for now. She does have more swelling to left leg than right. She has been elevating legs and trying compression socks as well She has been watching videos about animals being abused and she is upset since she has 3 cats. She states it is hard for her to eat animals or follow up some diets due to same She tried vegan diet for several weeks prior to her recent labs and was getting very tired and did not feel well. She is trying to get some protein in diet now.   Pts BP is still elevated. She admits she is not taking her HTN or DM medications. She is taking the lasix .   HISTORY:    I have reviewed the patients problem list, current medications, allergies, and social history and updated them as needed.  Medications Ordered Prior to Encounter[1] Allergies[2] Medical History[3] Family History[4] Surgical History[5] Health Maintenance  Topic Date Due  . Diabetes: Foot Exam  Never done  . HIV Screening  Never done  . Hepatitis C Screening  Never done  . Hepatitis B Vaccines (1 of 3 - 19+ 3-dose series) Never done  . Colorectal Cancer Screening  Never done  . ZOSTER VACCINE (1 of 2) Never done  . Pneumococcal Vaccine for Ages 50+ (3 of 3 - PCV20 or PCV21) 04/02/2023  . Cervical Cancer Screening  11/13/2023  . COVID-19 Vaccine (4 - 2025-26  season) 12/03/2023  . Influenza Vaccine (1) 11/02/2023  . Comprehensive Annual Visit  01/04/2024  . Diabetes: Hemoglobin A1C  03/01/2024  . Diabetes: Retinopathy Screening Combo  11/28/2024  . Diabetes:  eGFR for Kidney Evaluation  11/29/2024  . Diabetes:  Quantitative uACR for Kidney Evaluation  11/29/2024  . Depression Screening  12/10/2024  . Breast Cancer Screening (Mammogram)  01/07/2025  . DTaP/Tdap/Td Vaccines (2 - Td or Tdap) 04/25/2026  . Adult RSV (60+ Years or Pregnancy) (1 - 1-dose 75+ series) 12/31/2044  . HIB Vaccines  Aged Out  . IPV Vaccines  Aged Out  . Hepatitis A Vaccines  Aged Out  . Meningococcal Conjugate  (ACWY) Vaccine  Aged Out  . Rotavirus Vaccines  Aged Out  . HPV Vaccines  Aged Out  . Meningococcal B Vaccine  Aged Out  . Diabetes Screening  Discontinued    Ms. Vandehei  reports that she quit smoking about 18 years ago. Her smoking use included cigarettes. She started smoking about 38 years ago. She has a 40 pack-year smoking history. She has never used smokeless tobacco.  OBJECTIVE:   Ms. Tieszen  height is 1.626 m (5' 4) and weight is 119 kg (262 lb). Her temporal temperature is 97 F (36.1 C). Her blood pressure is 164/115 (abnormal) and her pulse is 110. Her oxygen saturation is 95%.  Results for orders placed or performed in visit on 11/30/23  Comprehensive Metabolic Panel   Collection Time: 11/30/23  3:06 PM  Result Value Ref Range   Sodium 131 (L) 136 - 145 mmol/L   Potassium 4.6 3.5 - 5.1 mmol/L   Chloride 95 (L) 98 - 107 mmol/L   CO2 29 21 - 31 mmol/L   Anion Gap 7 6 - 14 mmol/L   Glucose, Random 431 (H) 70 - 99 mg/dL   Blood Urea Nitrogen (BUN) 13 7 - 25 mg/dL   Creatinine 9.22 9.39 - 1.20 mg/dL   eGFR >09 >40 fO/fpw/8.26f7   Albumin 3.6 3.5 - 5.7 g/dL   Total Protein 6.2 (L) 6.4 - 8.9 g/dL   Bilirubin, Total 1.2 (H) 0.3 - 1.0 mg/dL   Alkaline Phosphatase (ALP) 113 (H) 34 - 104 U/L   Aspartate Aminotransferase (AST) 24 13 - 39 U/L   Alanine Aminotransferase (ALT) 24 7 - 52 U/L   Calcium 9.2 8.6 - 10.3 mg/dL   BUN/Creatinine Ratio    Hemoglobin A1C With Estimated Average Glucose   Collection Time: 11/30/23  3:06 PM  Result Value Ref Range   Hemoglobin A1c 15.2 (H) <5.7 %   Estimated Average Glucose 390 mg/dL  Lipid Panel   Collection Time: 11/30/23  3:06 PM  Result Value Ref Range   Cholesterol, Total, Lipid Panel 138 <200 mg/dL   Triglycerides, Lipid Panel 90 <150 mg/dL   HDL Cholesterol - Lipid Panel 34 (L) >=60 mg/dL   LDL Cholesterol, Calculated 86 <100 mg/dL   Non-HDL Cholesterol 895 mg/dL  Albumin, Random Urine   Collection Time: 11/30/23  3:06 PM   Result Value Ref Range   Albumin, Urine 239 Not Established mg/L   Creatinine, Urine 28 >=20 mg/dL   Albumin/Creatinine Ratio, Urine 854 (H) 0 - 30 mg/g creat  CBC with Differential   Collection Time: 11/30/23  3:06 PM  Result Value Ref Range   WBC 8.60 4.40 - 11.00 10*3/uL   RBC 5.45 (H) 4.10 - 5.10 10*6/uL   Hemoglobin 14.5 12.3 - 15.3 g/dL   Hematocrit 55.4 64.0 - 44.6 %  Mean Corpuscular Volume (MCV) 81.7 80.0 - 96.0 fL   Mean Corpuscular Hemoglobin (MCH) 26.6 (L) 27.5 - 33.2 pg   Mean Corpuscular Hemoglobin Conc (MCHC) 32.6 (L) 33.0 - 37.0 g/dL   Red Cell Distribution Width (RDW) 15.3 12.3 - 17.0 %   Platelet Count (PLT) 296 150 - 450 10*3/uL   Mean Platelet Volume (MPV) 10.4 (H) 6.8 - 10.2 fL   Neutrophils % 67 %   Lymphocytes % 22 %   Monocytes % 7 %   Eosinophils % 2 %   Basophils % 1 %   nRBC % 0 %   Neutrophils Absolute 5.80 1.80 - 7.80 10*3/uL   Lymphocytes # 1.90 1.00 - 4.80 10*3/uL   Monocytes # 0.60 0.00 - 0.80 10*3/uL   Eosinophils # 0.20 0.00 - 0.50 10*3/uL   Basophils # 0.10 0.00 - 0.20 10*3/uL   nRBC Absolute 0.00 <=0.00 10*3/uL   Body mass index is 44.97 kg/m.   Review of Systems  All other systems reviewed and are negative.     PHYSICAL:    Physical Exam Constitutional:      General: She is not in acute distress.    Appearance: She is obese.  Musculoskeletal:     Right lower leg: No edema.     Left lower leg: Swelling and tenderness present. No deformity. 1+ Pitting Edema present.     Comments: Has posterior calf swelling and tenderness with increased pain with compression   Skin:    General: Skin is warm and dry.     Findings: Erythema present.     Comments: Has erythematous area to posterior lower leg of left side with mild warmth. Just distal to area of swelling.   Neurological:     Mental Status: She is alert and oriented to person, place, and time.    Face to face conversation with patient concerning possible etiologies of leg pain  swelling and redness. Concern for possible recurrent DVT and also possible cellulitis. Discussed with patient I can not fully evaluate with out getting US  done of her leg. She agrees to getting imaging done. Prolonged face to face conversation with her again concerning untreated diabetes and blood pressure. Discussed at length complications she is facing with not working on these issues. Reiterated if she has financial concerns there are ways she can be helped but she does not wish to pursue at this time.  Discussed her current situation is very likely to lead to critical health issues including heart attack, stroke, kidney failure, diabetes amputation and other serious issues. She does voice understanding.  Total time spent with patient was  50  minutes and greater than 50% of that time was spent in counseling and coordinating care with the patient regarding all of the above   This document serves as a record of services personally performed by Virginia  Fulbright PA-C.  It was created on their behalf by Arland Earnie Sor CMA, a trained medical scribe, and Certified Medical Assistant (CMA). During the course of documenting the history, physical exam and medical decision making, I was functioning as a Stage manager. The creation of this record is the provider's dictation and/or activities during the visit.  Electronically signed by Arland Earnie Sor, CMA 12/11/2023 2:10 PM    I agree the documentation is accurate and complete.  Electronically signed by: Virginia  Almarie Due, PA-C 12/11/2023 4:38 PM        [1] Current Outpatient Medications on File Prior to Visit  Medication Sig Dispense  Refill  . furosemide  (LASIX ) 20 mg tablet Take 1 tablet (20 mg total) by mouth daily. 30 tablet 0  . silver sulfADIAZINE (SILVADENE) 1 % cream Apply topically daily. 50 g 0  . Accu-Chek Guide test strips test strip CHECK BLOOD SUGAR TWICE DAILY (Patient not taking: Reported on 12/11/2023) 200 strip 1  .  carvediloL (COREG) 6.25 mg tablet Take 1 tablet (6.25 mg total) by mouth in the morning and 1 tablet (6.25 mg total) in the evening. Take with meals. (Patient not taking: Reported on 12/11/2023) 60 tablet 0  . metFORMIN  (GLUCOPHAGE -XR) 500 mg 24 hr tablet Take 1 tablet (500 mg total) by mouth 2 (two) times a day. (Patient not taking: Reported on 12/11/2023) 60 tablet 0  . pen needle, diabetic (BD Nano 2nd Gen Pen Needle) 32 gauge x 5/32 ndle USE TO ADMINSTER MEDICATION (Patient not taking: Reported on 12/11/2023) 100 each 0   No current facility-administered medications on file prior to visit.  [2] Allergies Allergen Reactions  . Ace Inhibitors Angioedema    Tongue Swelling on Lisinopril   . Empagliflozin  Hives  [3] Past Medical History: Diagnosis Date  . Anemia, iron deficiency   . Chronic HFrEF (heart failure with reduced ejection fraction)    (CMD)   . Chronic obstructive pulmonary disease    (CMD)   . 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 Deep vein thrombosis (DVT)    (CMD) 11/13/2018  . Essential hypertension   . FH: colon cancer in first degree relative <49 years old   . Gastroesophageal reflux disease without esophagitis   . Hypoalbuminemia   . Hypomagnesemia   . Hyponatremia   . Microcytic anemia   . Mild tricuspid regurgitation   . Mixed hyperlipidemia   . Non compliance w medication regimen   . Nonrheumatic mitral valve regurgitation   . Obesity due to excess calories with serious comorbidity   . OSA (obstructive sleep apnea)    Stopped using CPAP around 08/2013.  SABRA Poor compliance with CPAP treatment   . Pulmonary embolism    (CMD)   . Pulmonary hypertension associated with systemic disorder    (CMD)   . Skin-picking disorder   . Type 2 diabetes mellitus with hyperglycemia    (CMD)   [4] Family History Problem Relation Name Age of Onset  . Heart disease Mother    . Hyperlipidemia Mother    . Hypertension Mother    .  Cancer Other    . Diabetes Other    . Stroke Other    [5] No past surgical history on file.

## 2023-12-20 NOTE — Telephone Encounter (Signed)
 Copied from CRM #[id number]. Topic: Clinical Concerns - Medical Question >> Dec 20, 2023  3:05 PM Tiffany W wrote: Kersti, Scavone is calling for clinical concerns (Ask: What symptoms are you calling about today, AND how long have you had these symptoms? Must Review HPKW list for symptoms) Document Name of Triage Nurse/BH Rep taking the call when applicable) and other request    Include all details related to the request(s) below: Patient is calling to report she completed the ultrasound of her legs veinous, she states she does have blood clots in her left leg.  Patient states Dr.Arthur provided her with a sample of Eliquis, she she will be able to take this for the next week but she will need a prescription after that.   Confirm and type the Best Contact Number below:  Patient/caller contact number: [phone]            [] Home  [x] Mobile  [] Work [] Other   [x] Okay to leave a voicemail   Medication List:  Current Outpatient Medications:  .  Accu-Chek Guide test strips test strip, CHECK BLOOD SUGAR TWICE DAILY (Patient not taking: Reported on 12/11/2023), Disp: 200 strip, Rfl: 1 .  carvediloL (COREG) 6.25 mg tablet, Take 1 tablet (6.25 mg total) by mouth in the morning and 1 tablet (6.25 mg total) in the evening. Take with meals. (Patient not taking: Reported on 12/11/2023), Disp: 60 tablet, Rfl: 0 .  cephALEXin  (KEFLEX ) 500 mg capsule, Take 1 capsule (500 mg total) by mouth 3 (three) times a day for 5 days., Disp: 15 capsule, Rfl: 0 .  furosemide  (LASIX ) 20 mg tablet, Take 1 tablet (20 mg total) by mouth daily., Disp: 30 tablet, Rfl: 0 .  metFORMIN  (GLUCOPHAGE -XR) 500 mg 24 hr tablet, Take 1 tablet (500 mg total) by mouth 2 (two) times a day. (Patient not taking: Reported on 12/11/2023), Disp: 60 tablet, Rfl: 0 .  pen needle, diabetic (BD Nano 2nd Gen Pen Needle) 32 gauge x 5/32 ndle, USE TO ADMINSTER MEDICATION (Patient not taking: Reported on 12/11/2023), Disp: 100 each, Rfl: 0 .  silver  sulfADIAZINE (SILVADENE) 1 % cream, Apply topically daily., Disp: 50 g, Rfl: 0     Medication Request/Refills: Pharmacy Information (if applicable)   [] Not Applicable       []  Pharmacy listed  Send Medication Request to:                                                 [] Pharmacy not listed (added to pharmacy list in Epic) Send Medication Request to:      Listed Pharmacies: [id number] - [place], [place] - [address] AT [address] - PHONE: [phone] - FAX: [phone]

## 2023-12-31 ENCOUNTER — Encounter: Payer: Self-pay | Admitting: Hematology & Oncology
